# Patient Record
Sex: Female | Born: 1960 | Race: White | Hispanic: No | Smoking: Current every day smoker
Health system: Southern US, Community
[De-identification: ages and names within clinical notes are randomized; demographics above are authoritative.]

## PROBLEM LIST (undated history)

## (undated) DIAGNOSIS — F32A Depression, unspecified: Secondary | ICD-10-CM

## (undated) DIAGNOSIS — M545 Low back pain, unspecified: Secondary | ICD-10-CM

## (undated) DIAGNOSIS — K311 Adult hypertrophic pyloric stenosis: Secondary | ICD-10-CM

## (undated) DIAGNOSIS — F329 Major depressive disorder, single episode, unspecified: Secondary | ICD-10-CM

## (undated) DIAGNOSIS — G43909 Migraine, unspecified, not intractable, without status migrainosus: Secondary | ICD-10-CM

## (undated) DIAGNOSIS — K3189 Other diseases of stomach and duodenum: Secondary | ICD-10-CM

## (undated) DIAGNOSIS — G8929 Other chronic pain: Secondary | ICD-10-CM

## (undated) DIAGNOSIS — J189 Pneumonia, unspecified organism: Secondary | ICD-10-CM

## (undated) HISTORY — DX: Pneumonia, unspecified organism: J18.9

## (undated) HISTORY — PX: CERVICAL SPINE SURGERY: SHX589

## (undated) HISTORY — DX: Adult hypertrophic pyloric stenosis: K31.1

## (undated) HISTORY — DX: Other diseases of stomach and duodenum: K31.89

## (undated) HISTORY — PX: BACK SURGERY: SHX140

## (undated) HISTORY — PX: COLONOSCOPY: SHX174

## (undated) HISTORY — PX: TUBAL LIGATION: SHX77

## (undated) HISTORY — PX: ROTATOR CUFF REPAIR: SHX139

---

## 2006-01-21 ENCOUNTER — Ambulatory Visit (HOSPITAL_COMMUNITY): Admission: RE | Admit: 2006-01-21 | Discharge: 2006-01-21 | Payer: Self-pay | Admitting: Family Medicine

## 2006-02-01 ENCOUNTER — Ambulatory Visit: Payer: Self-pay | Admitting: Orthopedic Surgery

## 2006-02-11 ENCOUNTER — Encounter: Admission: RE | Admit: 2006-02-11 | Discharge: 2006-02-11 | Payer: Self-pay | Admitting: Orthopedic Surgery

## 2006-02-25 ENCOUNTER — Encounter: Admission: RE | Admit: 2006-02-25 | Discharge: 2006-02-25 | Payer: Self-pay | Admitting: Neurosurgery

## 2006-03-11 ENCOUNTER — Encounter: Admission: RE | Admit: 2006-03-11 | Discharge: 2006-03-11 | Payer: Self-pay | Admitting: Orthopedic Surgery

## 2006-03-24 ENCOUNTER — Ambulatory Visit: Payer: Self-pay | Admitting: Orthopedic Surgery

## 2006-05-04 ENCOUNTER — Emergency Department (HOSPITAL_COMMUNITY): Admission: EM | Admit: 2006-05-04 | Discharge: 2006-05-04 | Payer: Self-pay | Admitting: Emergency Medicine

## 2006-05-12 ENCOUNTER — Ambulatory Visit: Payer: Self-pay | Admitting: Orthopedic Surgery

## 2006-05-25 ENCOUNTER — Ambulatory Visit (HOSPITAL_COMMUNITY): Admission: RE | Admit: 2006-05-25 | Discharge: 2006-05-25 | Payer: Self-pay | Admitting: Obstetrics & Gynecology

## 2006-11-12 ENCOUNTER — Ambulatory Visit (HOSPITAL_COMMUNITY): Admission: RE | Admit: 2006-11-12 | Discharge: 2006-11-12 | Payer: Self-pay | Admitting: Obstetrics and Gynecology

## 2006-11-23 ENCOUNTER — Emergency Department (HOSPITAL_COMMUNITY): Admission: EM | Admit: 2006-11-23 | Discharge: 2006-11-23 | Payer: Self-pay | Admitting: Emergency Medicine

## 2007-06-15 ENCOUNTER — Ambulatory Visit (HOSPITAL_COMMUNITY): Admission: RE | Admit: 2007-06-15 | Discharge: 2007-06-15 | Payer: Self-pay | Admitting: Obstetrics and Gynecology

## 2007-11-14 ENCOUNTER — Ambulatory Visit (HOSPITAL_COMMUNITY): Admission: RE | Admit: 2007-11-14 | Discharge: 2007-11-14 | Payer: Self-pay | Admitting: Obstetrics and Gynecology

## 2008-07-05 ENCOUNTER — Other Ambulatory Visit: Admission: RE | Admit: 2008-07-05 | Discharge: 2008-07-05 | Payer: Self-pay | Admitting: Obstetrics and Gynecology

## 2008-08-28 ENCOUNTER — Ambulatory Visit (HOSPITAL_COMMUNITY): Admission: RE | Admit: 2008-08-28 | Discharge: 2008-08-28 | Payer: Self-pay | Admitting: Anesthesiology

## 2008-11-28 ENCOUNTER — Ambulatory Visit (HOSPITAL_COMMUNITY): Admission: RE | Admit: 2008-11-28 | Discharge: 2008-11-28 | Payer: Self-pay | Admitting: Obstetrics and Gynecology

## 2009-03-08 ENCOUNTER — Emergency Department (HOSPITAL_COMMUNITY): Admission: EM | Admit: 2009-03-08 | Discharge: 2009-03-08 | Payer: Self-pay | Admitting: Emergency Medicine

## 2009-03-11 ENCOUNTER — Encounter (INDEPENDENT_AMBULATORY_CARE_PROVIDER_SITE_OTHER): Payer: Self-pay | Admitting: *Deleted

## 2009-03-15 ENCOUNTER — Telehealth (INDEPENDENT_AMBULATORY_CARE_PROVIDER_SITE_OTHER): Payer: Self-pay | Admitting: *Deleted

## 2009-03-19 ENCOUNTER — Encounter: Payer: Self-pay | Admitting: Urgent Care

## 2009-03-19 ENCOUNTER — Ambulatory Visit: Payer: Self-pay | Admitting: Gastroenterology

## 2009-03-19 DIAGNOSIS — R1084 Generalized abdominal pain: Secondary | ICD-10-CM | POA: Insufficient documentation

## 2009-03-19 DIAGNOSIS — E876 Hypokalemia: Secondary | ICD-10-CM | POA: Insufficient documentation

## 2009-03-19 DIAGNOSIS — R112 Nausea with vomiting, unspecified: Secondary | ICD-10-CM

## 2009-03-19 DIAGNOSIS — R197 Diarrhea, unspecified: Secondary | ICD-10-CM

## 2009-03-20 ENCOUNTER — Encounter: Payer: Self-pay | Admitting: Gastroenterology

## 2009-03-20 ENCOUNTER — Ambulatory Visit (HOSPITAL_COMMUNITY): Admission: RE | Admit: 2009-03-20 | Discharge: 2009-03-20 | Payer: Self-pay | Admitting: Gastroenterology

## 2009-03-20 ENCOUNTER — Ambulatory Visit: Payer: Self-pay | Admitting: Gastroenterology

## 2009-03-22 LAB — CONVERTED CEMR LAB
Basophils Relative: 0 % (ref 0–1)
Chloride: 106 meq/L (ref 96–112)
Lymphocytes Relative: 33 % (ref 12–46)
Lymphs Abs: 2.8 10*3/uL (ref 0.7–4.0)
MCHC: 32.8 g/dL (ref 30.0–36.0)
Monocytes Relative: 6 % (ref 3–12)
Neutro Abs: 5.2 10*3/uL (ref 1.7–7.7)
Neutrophils Relative %: 61 % (ref 43–77)
RBC: 4.6 M/uL (ref 3.87–5.11)
Sed Rate: 10 mm/hr (ref 0–22)
Sodium: 138 meq/L (ref 135–145)
WBC: 8.6 10*3/uL (ref 4.0–10.5)

## 2009-03-26 ENCOUNTER — Telehealth (INDEPENDENT_AMBULATORY_CARE_PROVIDER_SITE_OTHER): Payer: Self-pay

## 2009-04-03 ENCOUNTER — Telehealth (INDEPENDENT_AMBULATORY_CARE_PROVIDER_SITE_OTHER): Payer: Self-pay | Admitting: *Deleted

## 2009-04-15 ENCOUNTER — Telehealth: Payer: Self-pay | Admitting: Gastroenterology

## 2009-05-01 ENCOUNTER — Ambulatory Visit: Payer: Self-pay | Admitting: Gastroenterology

## 2009-05-01 DIAGNOSIS — A048 Other specified bacterial intestinal infections: Secondary | ICD-10-CM | POA: Insufficient documentation

## 2009-05-02 ENCOUNTER — Encounter: Payer: Self-pay | Admitting: Gastroenterology

## 2009-06-04 ENCOUNTER — Ambulatory Visit (HOSPITAL_COMMUNITY): Admission: RE | Admit: 2009-06-04 | Discharge: 2009-06-05 | Payer: Self-pay | Admitting: Specialist

## 2009-08-08 ENCOUNTER — Other Ambulatory Visit: Admission: RE | Admit: 2009-08-08 | Discharge: 2009-08-08 | Payer: Self-pay | Admitting: Adult Health

## 2009-09-11 ENCOUNTER — Ambulatory Visit (HOSPITAL_COMMUNITY): Admission: RE | Admit: 2009-09-11 | Discharge: 2009-09-12 | Payer: Self-pay | Admitting: Orthopedic Surgery

## 2009-09-18 ENCOUNTER — Encounter: Payer: Self-pay | Admitting: Orthopedic Surgery

## 2009-09-27 ENCOUNTER — Encounter: Payer: Self-pay | Admitting: Gastroenterology

## 2009-12-09 ENCOUNTER — Ambulatory Visit (HOSPITAL_COMMUNITY): Admission: RE | Admit: 2009-12-09 | Discharge: 2009-12-09 | Payer: Self-pay | Admitting: Obstetrics and Gynecology

## 2010-05-16 ENCOUNTER — Emergency Department (HOSPITAL_COMMUNITY): Admission: EM | Admit: 2010-05-16 | Discharge: 2010-05-16 | Payer: Self-pay | Admitting: Emergency Medicine

## 2010-05-28 ENCOUNTER — Emergency Department (HOSPITAL_COMMUNITY): Admission: EM | Admit: 2010-05-28 | Discharge: 2010-05-28 | Payer: Self-pay | Admitting: Emergency Medicine

## 2010-06-09 ENCOUNTER — Ambulatory Visit (HOSPITAL_COMMUNITY): Payer: Self-pay | Admitting: Psychiatry

## 2010-07-17 ENCOUNTER — Encounter: Payer: Self-pay | Admitting: Gastroenterology

## 2010-07-23 ENCOUNTER — Encounter (INDEPENDENT_AMBULATORY_CARE_PROVIDER_SITE_OTHER): Payer: Self-pay | Admitting: *Deleted

## 2010-08-21 ENCOUNTER — Ambulatory Visit: Payer: Self-pay | Admitting: Vascular Surgery

## 2010-08-21 ENCOUNTER — Inpatient Hospital Stay (HOSPITAL_COMMUNITY): Admission: RE | Admit: 2010-08-21 | Discharge: 2010-08-23 | Payer: Self-pay | Admitting: Orthopedic Surgery

## 2010-08-22 ENCOUNTER — Encounter (INDEPENDENT_AMBULATORY_CARE_PROVIDER_SITE_OTHER): Payer: Self-pay | Admitting: Orthopedic Surgery

## 2010-08-24 ENCOUNTER — Emergency Department (HOSPITAL_COMMUNITY): Admission: EM | Admit: 2010-08-24 | Discharge: 2010-08-24 | Payer: Self-pay | Admitting: Emergency Medicine

## 2010-10-15 ENCOUNTER — Encounter (HOSPITAL_COMMUNITY)
Admission: RE | Admit: 2010-10-15 | Discharge: 2010-11-14 | Payer: Self-pay | Source: Home / Self Care | Admitting: Orthopedic Surgery

## 2010-11-04 ENCOUNTER — Ambulatory Visit: Payer: Self-pay | Admitting: Gastroenterology

## 2010-11-05 ENCOUNTER — Ambulatory Visit (HOSPITAL_COMMUNITY): Payer: Self-pay | Admitting: Psychiatry

## 2010-11-06 DIAGNOSIS — K5289 Other specified noninfective gastroenteritis and colitis: Secondary | ICD-10-CM | POA: Insufficient documentation

## 2010-11-12 ENCOUNTER — Ambulatory Visit (HOSPITAL_COMMUNITY): Payer: Self-pay | Admitting: Psychiatry

## 2010-11-17 ENCOUNTER — Encounter (HOSPITAL_COMMUNITY)
Admission: RE | Admit: 2010-11-17 | Discharge: 2010-12-17 | Payer: Self-pay | Source: Home / Self Care | Attending: Orthopedic Surgery | Admitting: Orthopedic Surgery

## 2010-11-19 ENCOUNTER — Ambulatory Visit (HOSPITAL_COMMUNITY): Payer: Self-pay | Admitting: Psychiatry

## 2010-11-26 ENCOUNTER — Ambulatory Visit (HOSPITAL_COMMUNITY): Payer: Self-pay | Admitting: Psychiatry

## 2010-11-27 ENCOUNTER — Emergency Department (HOSPITAL_COMMUNITY)
Admission: EM | Admit: 2010-11-27 | Discharge: 2010-11-27 | Payer: Self-pay | Source: Home / Self Care | Admitting: Emergency Medicine

## 2010-12-11 ENCOUNTER — Ambulatory Visit (HOSPITAL_COMMUNITY)
Admission: RE | Admit: 2010-12-11 | Discharge: 2010-12-11 | Payer: Self-pay | Source: Home / Self Care | Attending: Obstetrics and Gynecology | Admitting: Obstetrics and Gynecology

## 2010-12-18 ENCOUNTER — Encounter (HOSPITAL_COMMUNITY)
Admission: RE | Admit: 2010-12-18 | Discharge: 2011-01-17 | Payer: Self-pay | Source: Home / Self Care | Attending: Orthopedic Surgery | Admitting: Orthopedic Surgery

## 2011-01-09 ENCOUNTER — Encounter
Admission: RE | Admit: 2011-01-09 | Discharge: 2011-01-09 | Payer: Self-pay | Source: Home / Self Care | Attending: Orthopedic Surgery | Admitting: Orthopedic Surgery

## 2011-01-16 NOTE — Op Note (Signed)
Rita [name], Rita [name]               ACCOUNT NO.:  [id number]  MEDICAL RECORD NO.:  [id number]          PATIENT TYPE:  OIB  LOCATION:  5030                         FACILITY:  [facility]  PHYSICIAN:  Alvy Beal, MD    DATE OF BIRTH:  02/01/1961  DATE OF PROCEDURE: DATE OF DISCHARGE:                              OPERATIVE REPORT  PREOPERATIVE DIAGNOSIS:  C5-6 disk herniation with right C6 radiculopathy.  POSTOPERATIVE DIAGNOSIS:  C5-6 disk herniation with right C6 radiculopathy.  PROCEDURE:  Anterior cervical diskectomy and fusion C5-6.  COMPLICATIONS:  None.  FIRST ASSISTANT:  Crissie Reese, PA  INTRAOPERATIVE FINDINGS:  Significant right C5-6 disk herniation removed without incident, placed a 7-mm allograft precut MTF lordotic spacer packed with Actifuse with a 14-mm anterior cervical Synthes Vector plate with 69-GE locking screws.  HISTORY:  This is a very pleasant 50 year old woman who presented with a 77-month history of progressive debilitating neck and right arm pain.  A clinical evaluation was significant for C6 radiculopathy with weakness in the C6 distribution.  After discussing treatment options, she elected to proceed with surgery.  All appropriate risks, benefits and alternatives to surgery were discussed and consent was obtained.  OPERATIVE NOTE:  The patient was brought to the operating room and placed supine on the operating table.  After successful induction of general anesthesia and endotracheal intubation, TED and SCDs were applied.  A folded sheet was placed between the shoulder blades and shoulders were taped down and then the neck was gently placed into slight extension.  The anterior cervical spine was prepped and draped in the usual standard fashion.  Appropriate preoperative time-out was done confirming patient, procedure and extremity of pain.  Once done, I proceeded with the surgery.  A transverse incision was made just at the level of the  cricoid cartilage.  Sharp dissection was carried out down through the deep fascia and through the platysma.  I then sharply dissected along the medial border of the sternocleidomastoid and performed a standard Evlyn Clines approach to the anterior cervical spine.  I gently swept the trachea and esophagus medially with my finger and protected it with a thyroid retractor.  I then visualized and protected the carotid sheath laterally with my finger until I could mobilize using Kittner dissectors the prevertebral fascia.  Once this was done, I placed a needle into the C5-6 disk space, took an intraoperative x-ray and confirmed I was at the appropriate level.  Once confirmed, I then mobilized the longus colli muscles out laterally from the midbody of C5 to the midbody of C6 until I could visualize the uncovertebral joints.  Caspar self retracting blades were placed into the wound.  The endotracheal cuff was deflated and the blades were expanded to the appropriate width.  Distraction pins were then placed into the bodies of C5 and C6 and then gently distracted to the C5-6 disk space.  An annulotomy was performed using a 15 blade scalpel and then using a combination of pituitary rongeurs, curettes and Kerrison rongeurs, I removed the entire disk material.  Using a micro nerve hook, I developed a plane underneath the posterior  longitudinal ligament and then used a 1-mm Kerrison to resect the posterior longitudinal ligament.  This allowed me to remove the bone spur and disk herniation and was mostly largely to the right.  At this point, I could see the anterior thecal sac and I was able to freely sweep my nerve hook underneath the endplates and underneath the uncovertebral joint with no significant problem.  I then rasped the endplates, measured and obtained a 7-mm precut lordotic MTF lower fibular strut, packed it with Actifuse and malleted it to the appropriate depth.  An anterior  cervical plate was then contoured and secured to the bodies of C5-C6 and then final x-rays were taken.  All retracting devices were removed.  I checked to ensure the esophagus was not entrapped beneath the plate.  I returned the trachea and esophagus to midline, obtained hemostasis using bipolar electrocautery, closed the platysma with interrupted 2-0 Vicryl sutures and the skin with 3-0 Monocryl.  Steri-Strips and dry dressing were applied.  The patient was extubated and transferred to the PACU without incident.  At the end of the case, all needle and sponge counts were correct.     Alvy Beal, MD Electronically Signed    DDB/MEDQ  D:  09/11/2009  T:  09/12/2009  Job:  [id number]  Electronically Signed by Venita Lick MD on 01/15/2011 08:45:52 PM

## 2011-01-18 ENCOUNTER — Encounter: Payer: Self-pay | Admitting: Obstetrics and Gynecology

## 2011-01-23 ENCOUNTER — Encounter: Payer: Self-pay | Admitting: Gastroenterology

## 2011-01-27 NOTE — Letter (Signed)
Summary: [facility] CA center note  [facility] center note   Imported By: Minna Merritts 07/17/2010 16:45:01  _____________________________________________________________________  External Attachment:    Type:   Image     Comment:   External Document

## 2011-01-27 NOTE — Letter (Signed)
Summary: Medical record req Disab Determination  Medical record req Disab Determination   Imported By: Cammie Sickle 04/19/2010 12:13:19  _____________________________________________________________________  External Attachment:    Type:   Image     Comment:   External Document

## 2011-01-27 NOTE — Letter (Signed)
Summary: Generic Letter, Intro to Referring  [facility]  [address]   [place], [place] [address]   Phone: [phone]  Fax: [phone]      July 23, 2010             RE: Rita [name]   1961-11-13                 [address]                 [place], [place]  [address]  Dear Kemper Durie,   Pt was a no show for her appointment yesterday to see Tana Coast, P.A.            Sincerely,    Diana Eves  [facility] Ph: [phone]   Fax: [phone]

## 2011-01-27 NOTE — Assessment & Plan Note (Signed)
Summary: Viral gastroenetritis   Visit Type:  Follow-up Visit Primary Care Provider:  Lubertha South, M.D.  Chief Complaint:  Colitis.  History of Present Illness: Seen in ED for abd pain and vomiting. CT A/P June 2011-showed ? bowel wall thickening in the descending colon. Sx lasted: 5 days. Repeat CT in AUG 2011: showed hemangioma in liver but nl bowel wall. Last TCS MAR 2010. Bms: having constipation, Rx: Dulcolax-2-3x/wk. No blood in stool. No black tarry stools. Appetite: none due to pain from surgery: AUG 25th. Pt now 143 lbs. Gained 3 lbs since [facility] 2010.   Current Medications (verified): 1)  Xanax 0.25 Mg Tabs (Alprazolam) .... Q 6 Hours 2)  Roxicodone 15 Mg Tabs (Oxycodone Hcl) .... One Tablet Every 4 Hours 3)  Cymbalta 60 Mg Cpep (Duloxetine Hcl) .... Take 1 Tablet By Mouth Once A Day  Allergies (verified): No Known Drug Allergies  Past History:  Past Medical History: ? Colitis V. Acute Gastroeneteritis-JUNE 2011 H. 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 Hardie Pulley [facility]) Pain management with Dr. Thyra Breed (last seen Jan 2010)  Past Surgical History: C-sect 1991 Tubal ligation 1992  Family History: Reviewed history from 03/19/2009 and no changes required. No known family history of colorectal carcinoma, IBD, liver or chronic GI problems. Father: (21's) ?  Mother: (60 deceased)  COPD Siblings: 1 brother deceased ? 2 living brothers- healthy  Vital Signs:  Patient profile:   50 year old female Height:      62 inches Weight:      143 pounds BMI:     26.25 Temp:     98.7 degrees F oral Pulse rate:   88 / minute BP sitting:   110 / 72  (left arm) Cuff size:   regular  Vitals Entered By: Cloria Spring LPN (November 04, 2010 9:13 AM)  Physical Exam  General:  Well developed, well nourished, no acute distress. Head:  Normocephalic and atraumatic. Mouth:  No deformity  or lesions. Lungs:  Clear throughout to auscultation. Heart:  Regular rate and rhythm; no murmurs. Abdomen:  Soft, nontender and nondistended. Normal bowel sounds.  Impression & Recommendations:  Problem # 1:  ABDOMINAL PAIN, GENERALIZED (ICD-789.07) Assessment Improved in June 2011, Sx most likely 2o to acute viral gastroenteritis. Pt currentlyASx. No indication for additional GI workup. OPV in 24 mos.  CC: PCP  Problem # 2:  SCREENING, COLON CANCER (ICD-V76.51) Assessment: Comment Only TCS 2020  Appended Document: Viral gastroenetritis REMINDER OF F/U OPV IS IN THE COMPUTER  Appended Document: Orders Update    Clinical Lists Changes  Problems: Added new problem of COLITIS (ICD-558.9) Orders: Added new Service order of Est. Patient Level III ([address]) - Signed

## 2011-01-29 NOTE — Letter (Signed)
Summary: AUTH FOR RELEASE OF MED INFO  AUTH FOR RELEASE OF MED INFO   Imported By: Rexene Alberts 01/23/2011 11:44:10  _____________________________________________________________________  External Attachment:    Type:   Image     Comment:   External Document

## 2011-02-04 IMAGING — CT CT ABD-PELV W/ CM
2 of 5 series · 17 of 46 positions shown, 19 images · IV contrast (Omnipaque 300)
Comparison: 03/08/2009

CLINICAL DATA: Nausea, vomiting, chills, abdominal pain, diarrhea

CT ABDOMEN AND PELVIS WITH CONTRAST
TECHNIQUE: Multidetector CT imaging of the abdomen and pelvis was
performed following the standard protocol during bolus
administration of intravenous contrast. Breast shield utilized.
Sagittal and coronal MPR images reconstructed from axial data set.
Contrast: 100 ml Fmnipaque-B66 IV; water as oral contrast

[Series 2: abd_pel_with 5.0 b40f · axial · 0.66mm/px · z∈[-460,-66]mm · 14 of 91 slices shown, 16 images]
[im 6/91  soft-tissue]
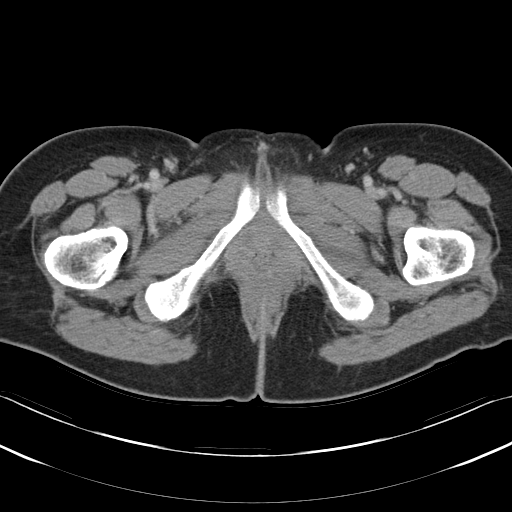
[im 6/91  bone]
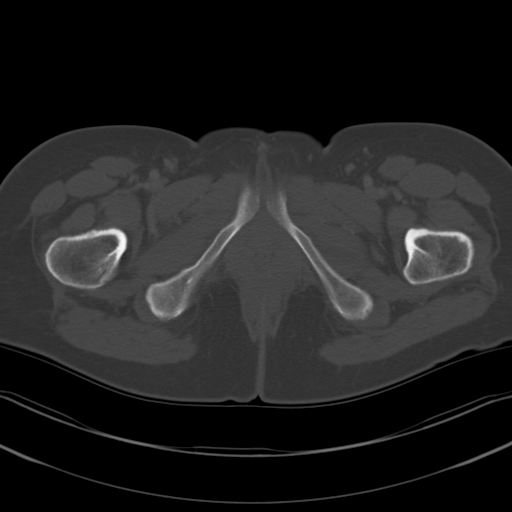
[im 11/91  soft-tissue]
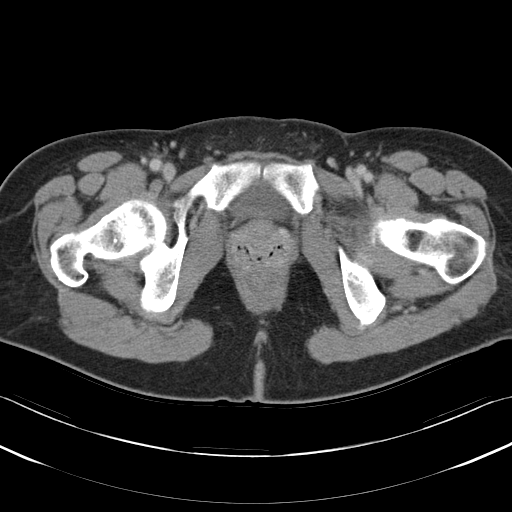
[im 16/91  soft-tissue]
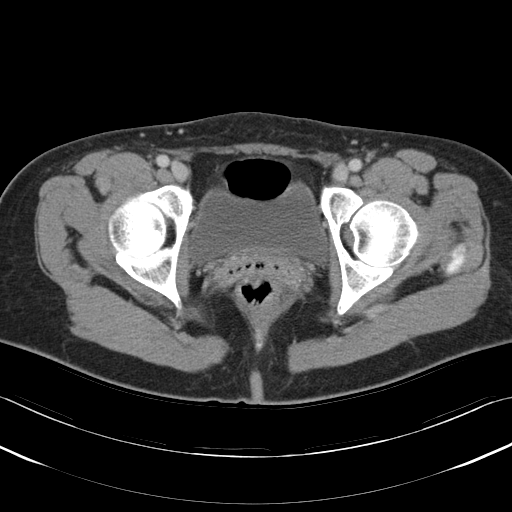
[im 27/91  soft-tissue]
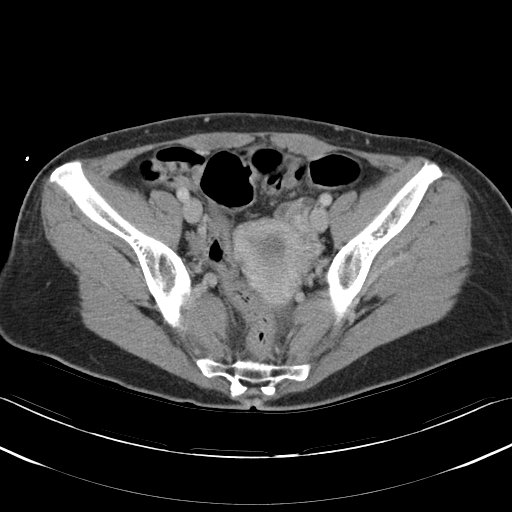
[im 32/91  soft-tissue]
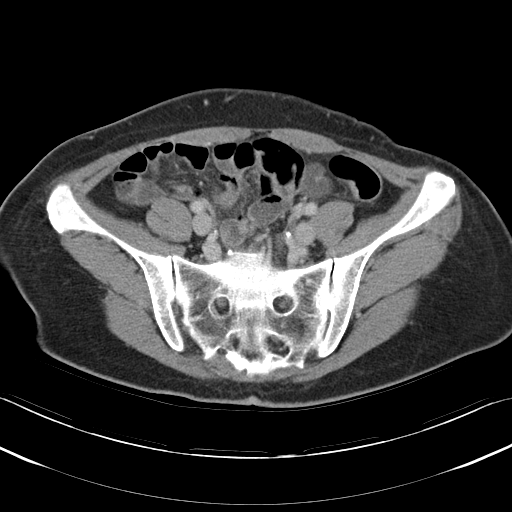
[im 38/91  soft-tissue]
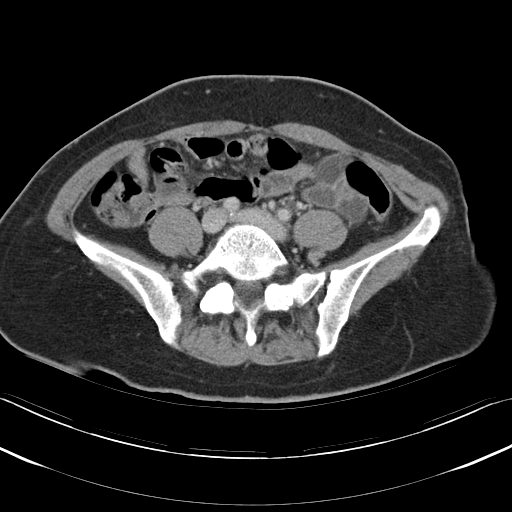
[im 43/91  soft-tissue]
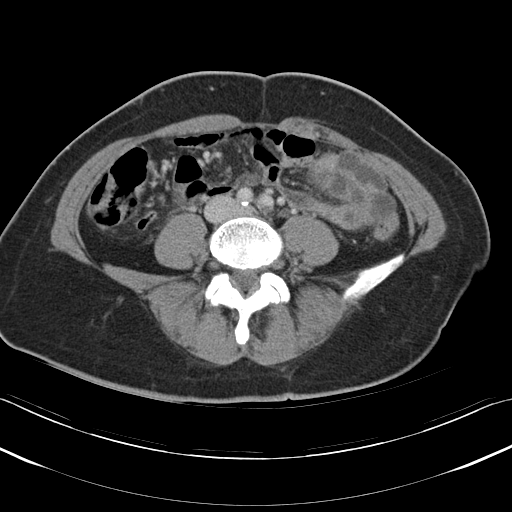
[im 48/91  soft-tissue]
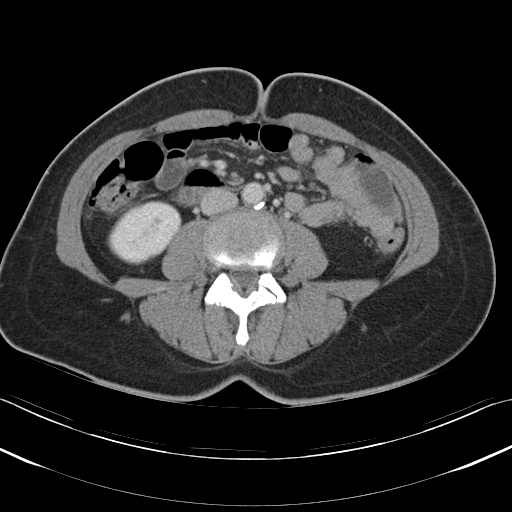
[im 53/91  soft-tissue]
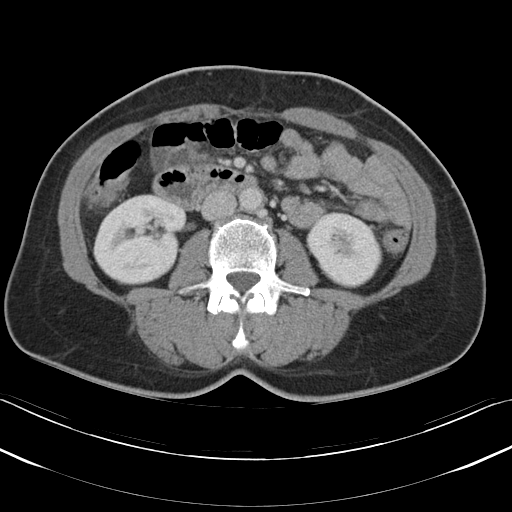
[im 53/91  bone]
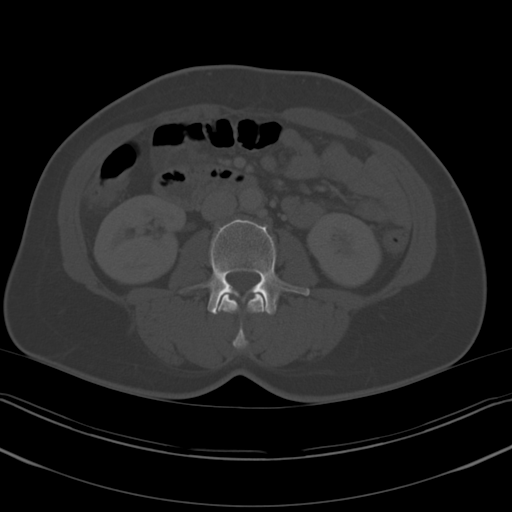
[im 59/91  soft-tissue]
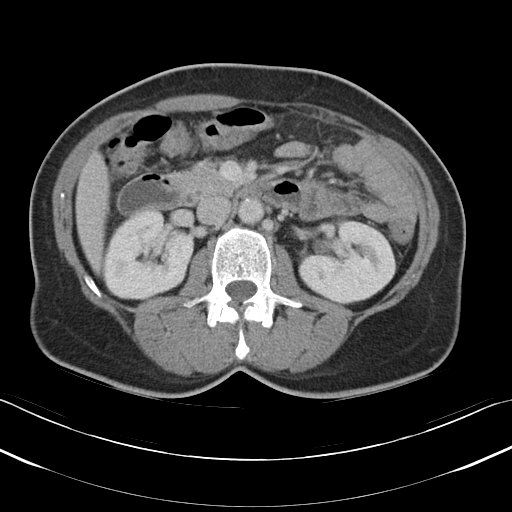
[im 69/91  soft-tissue]
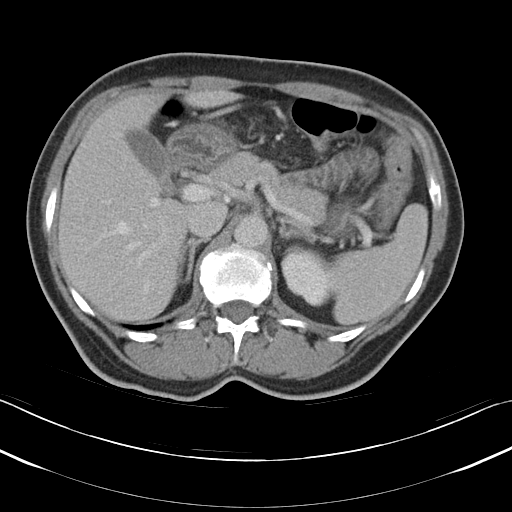
[im 75/91  soft-tissue]
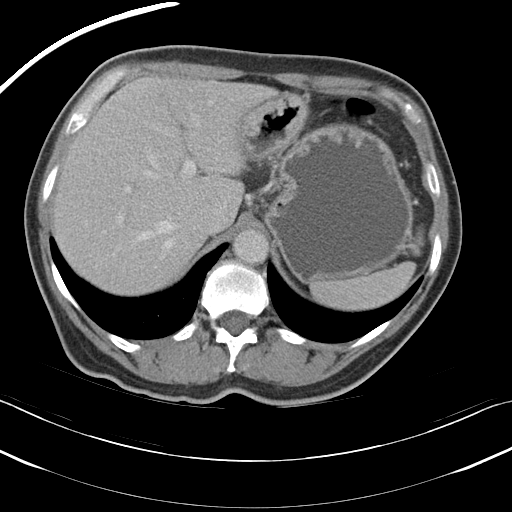
[im 80/91  soft-tissue]
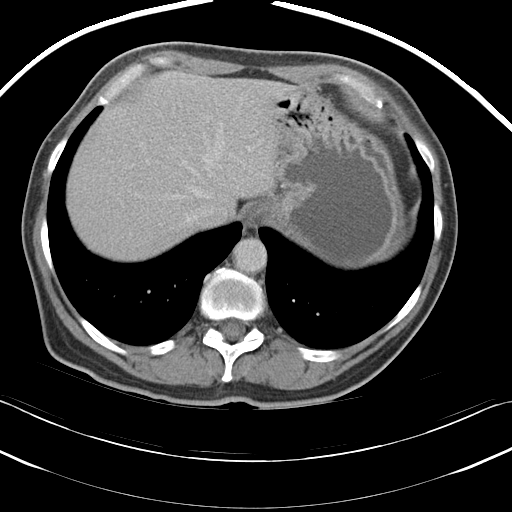
[im 85/91  soft-tissue]
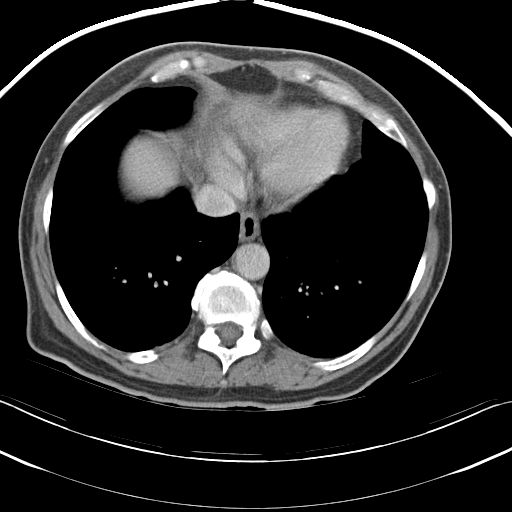

[Series 5: mpr cor post contrast (id) · coronal · 0.78mm/px · 3 of 75 slices shown]
[im 25/75  soft-tissue]
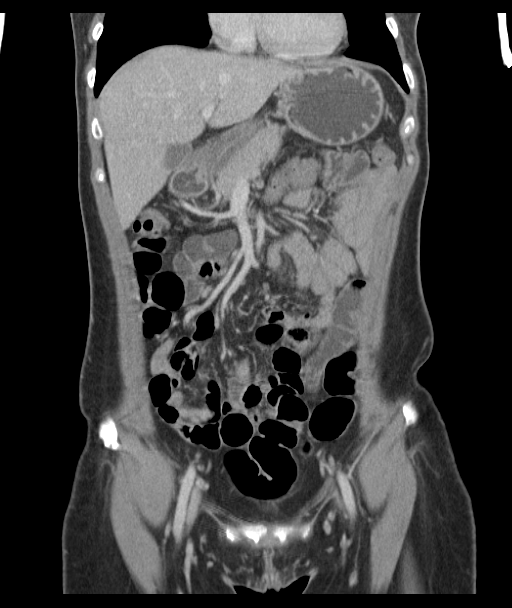
[im 33/75  soft-tissue]
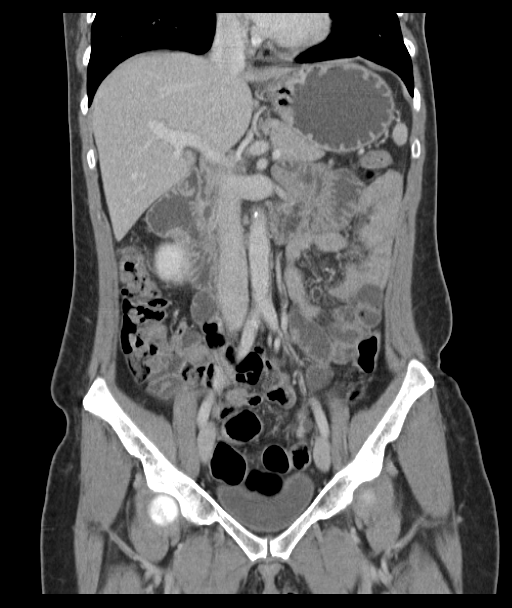
[im 42/75  soft-tissue]
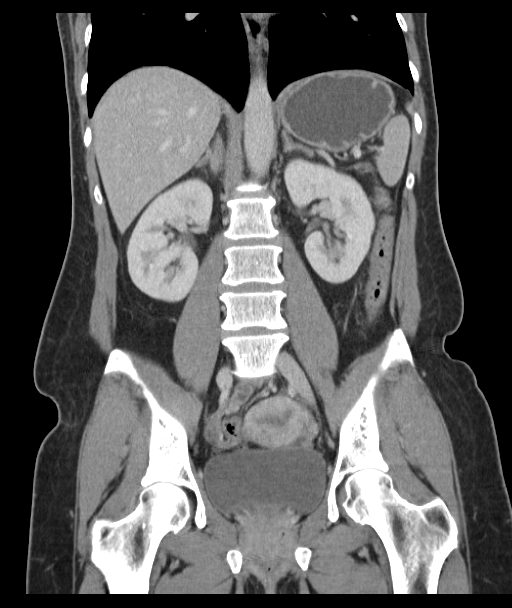

[17 of 46 positions shown; findings below may reference images not displayed]

FINDINGS: Lung bases clear.
Liver, spleen, pancreas, kidneys, and adrenal glands normal
appearance.
Normal appendix.
Stomach unremarkable.
Questionable wall thickening of descending colon versus
underdistension artifact.
Remaining bowel loops unremarkable grossly unremarkable for exam
limited by lack of contrast and adequate distention.
Small amount nonspecific free pelvic fluid.
Unremarkable bladder, distal ureters, uterus and adnexae.
No mass, adenopathy, or hernia.
No acute bony findings.
IMPRESSION: Questionable bowel wall thickening versus underdistension artifact
at descending colon, cannot exclude descending colitis.
Small amount nonspecific free pelvic fluid.
Remainder of exam unremarkable.

## 2011-03-09 LAB — DIFFERENTIAL
Basophils Absolute: 0 10*3/uL (ref 0.0–0.1)
Basophils Relative: 0 % (ref 0–1)
Eosinophils Relative: 0 % (ref 0–5)
Monocytes Absolute: 0.5 10*3/uL (ref 0.1–1.0)

## 2011-03-09 LAB — BASIC METABOLIC PANEL
BUN: 6 mg/dL (ref 6–23)
GFR calc non Af Amer: >60 mL/min (ref >60)
Glucose, Bld: 80 mg/dL (ref 70–99)
Potassium: 3.7 mEq/L (ref 3.5–5.1)

## 2011-03-09 LAB — POCT CARDIAC MARKERS
CKMB, poc: <1 ng/mL — ABNORMAL LOW (ref 1.0–8.0)
Myoglobin, poc: 23.9 ng/mL (ref 12–200)

## 2011-03-09 LAB — CBC
HCT: 38.3 % (ref 36.0–46.0)
MCHC: 35.5 g/dL (ref 30.0–36.0)
MCV: 85.1 fL (ref 78.0–100.0)
RDW: 14.3 % (ref 11.5–15.5)

## 2011-03-13 LAB — URINALYSIS, ROUTINE W REFLEX MICROSCOPIC
Hgb urine dipstick: NEGATIVE
Specific Gravity, Urine: 1.015 (ref 1.005–1.030)
Urobilinogen, UA: 1 mg/dL (ref 0.0–1.0)

## 2011-03-13 LAB — DIFFERENTIAL
Basophils Relative: 0 % (ref 0–1)
Lymphs Abs: 3 10*3/uL (ref 0.7–4.0)
Monocytes Relative: 10 % (ref 3–12)
Neutro Abs: 4.9 10*3/uL (ref 1.7–7.7)
Neutrophils Relative %: 55 % (ref 43–77)

## 2011-03-13 LAB — CBC
HCT: 37.7 % (ref 36.0–46.0)
Hemoglobin: 10.3 g/dL — ABNORMAL LOW (ref 12.0–15.0)
MCHC: 32.9 g/dL (ref 30.0–36.0)
Platelets: 183 10*3/uL (ref 150–400)
RBC: 3.5 MIL/uL — ABNORMAL LOW (ref 3.87–5.11)
RDW: 15.2 % (ref 11.5–15.5)
WBC: 9.5 10*3/uL (ref 4.0–10.5)

## 2011-03-13 LAB — BASIC METABOLIC PANEL
Calcium: 8.3 mg/dL — ABNORMAL LOW (ref 8.4–10.5)
GFR calc Af Amer: >60 mL/min (ref >60)
GFR calc non Af Amer: >60 mL/min (ref >60)
Sodium: 138 mEq/L (ref 135–145)

## 2011-03-13 LAB — TYPE AND SCREEN
ABO/RH(D): A POS
Antibody Screen: NEGATIVE

## 2011-03-13 LAB — POCT PREGNANCY, URINE: Preg Test, Ur: NEGATIVE

## 2011-03-16 LAB — DIFFERENTIAL
Basophils Relative: 0 % (ref 0–1)
Basophils Relative: 0 % (ref 0–1)
Eosinophils Absolute: 0 10*3/uL (ref 0.0–0.7)
Eosinophils Absolute: 0 10*3/uL (ref 0.0–0.7)
Eosinophils Relative: 0 % (ref 0–5)
Eosinophils Relative: 0 % (ref 0–5)
Lymphs Abs: 3 10*3/uL (ref 0.7–4.0)
Monocytes Relative: 2 % — ABNORMAL LOW (ref 3–12)
Neutrophils Relative %: 89 % — ABNORMAL HIGH (ref 43–77)
Neutrophils Relative %: 73 % (ref 43–77)

## 2011-03-16 LAB — LIPASE, BLOOD
Lipase: 31 U/L (ref 11–59)
Lipase: 108 U/L — ABNORMAL HIGH (ref 11–59)

## 2011-03-16 LAB — COMPREHENSIVE METABOLIC PANEL
ALT: 16 U/L (ref 0–35)
AST: 22 U/L (ref 0–37)
Albumin: 4 g/dL (ref 3.5–5.2)
Alkaline Phosphatase: 71 U/L (ref 39–117)
BUN: 9 mg/dL (ref 6–23)
CO2: 24 mEq/L (ref 19–32)
Chloride: 104 mEq/L (ref 96–112)
Creatinine, Ser: 0.59 mg/dL (ref 0.4–1.2)
GFR calc Af Amer: >60 mL/min (ref >60)
GFR calc non Af Amer: >60 mL/min (ref >60)
Glucose, Bld: 128 mg/dL — ABNORMAL HIGH (ref 70–99)
Glucose, Bld: 122 mg/dL — ABNORMAL HIGH (ref 70–99)
Potassium: 2.6 mEq/L — CL (ref 3.5–5.1)
Sodium: 139 mEq/L (ref 135–145)
Total Bilirubin: 0.9 mg/dL (ref 0.3–1.2)
Total Protein: 7.2 g/dL (ref 6.0–8.3)

## 2011-03-16 LAB — CBC
HCT: 42.3 % (ref 36.0–46.0)
Hemoglobin: 16.8 g/dL — ABNORMAL HIGH (ref 12.0–15.0)
MCHC: 33.2 g/dL (ref 30.0–36.0)
MCHC: 35.1 g/dL (ref 30.0–36.0)
MCV: 87.9 fL (ref 78.0–100.0)
Platelets: 191 10*3/uL (ref 150–400)
RBC: 5.61 MIL/uL — ABNORMAL HIGH (ref 3.87–5.11)
RDW: 14.7 % (ref 11.5–15.5)
WBC: 16 10*3/uL — ABNORMAL HIGH (ref 4.0–10.5)

## 2011-03-16 LAB — URINALYSIS, ROUTINE W REFLEX MICROSCOPIC
Bilirubin Urine: NEGATIVE
Glucose, UA: NEGATIVE mg/dL
Hgb urine dipstick: NEGATIVE
Ketones, ur: >80 mg/dL — AB
Protein, ur: NEGATIVE mg/dL
Specific Gravity, Urine: 1.01 (ref 1.005–1.030)
Urobilinogen, UA: 1 mg/dL (ref 0.0–1.0)
Urobilinogen, UA: 0.2 mg/dL (ref 0.0–1.0)

## 2011-03-16 LAB — URINE MICROSCOPIC-ADD ON

## 2011-04-03 LAB — CBC
MCHC: 33.6 g/dL (ref 30.0–36.0)
MCV: 90.4 fL (ref 78.0–100.0)
Platelets: 165 10*3/uL (ref 150–400)
RBC: 4.12 MIL/uL (ref 3.87–5.11)
WBC: 7.8 10*3/uL (ref 4.0–10.5)

## 2011-04-03 LAB — BASIC METABOLIC PANEL
BUN: 4 mg/dL — ABNORMAL LOW (ref 6–23)
CO2: 24 mEq/L (ref 19–32)
Calcium: 9.2 mg/dL (ref 8.4–10.5)
Chloride: 107 mEq/L (ref 96–112)
Creatinine, Ser: 0.62 mg/dL (ref 0.4–1.2)
GFR calc Af Amer: >60 mL/min (ref >60)

## 2011-04-03 LAB — DIFFERENTIAL
Basophils Relative: 1 % (ref 0–1)
Eosinophils Absolute: 0 10*3/uL (ref 0.0–0.7)
Neutro Abs: 4.2 10*3/uL (ref 1.7–7.7)
Neutrophils Relative %: 54 % (ref 43–77)

## 2011-04-06 LAB — CBC
HCT: 37.5 % (ref 36.0–46.0)
Hemoglobin: 12.2 g/dL (ref 12.0–15.0)
RBC: 4.18 MIL/uL (ref 3.87–5.11)
WBC: 8 10*3/uL (ref 4.0–10.5)

## 2011-04-06 LAB — BASIC METABOLIC PANEL
Calcium: 9.4 mg/dL (ref 8.4–10.5)
GFR calc Af Amer: >60 mL/min (ref >60)
GFR calc non Af Amer: >60 mL/min (ref >60)
Potassium: 4.1 mEq/L (ref 3.5–5.1)
Sodium: 140 mEq/L (ref 135–145)

## 2011-04-09 LAB — URINALYSIS, ROUTINE W REFLEX MICROSCOPIC
Glucose, UA: NEGATIVE mg/dL
Protein, ur: NEGATIVE mg/dL
Specific Gravity, Urine: 1.015 (ref 1.005–1.030)
pH: 6.5 (ref 5.0–8.0)

## 2011-04-09 LAB — DIFFERENTIAL
Basophils Absolute: 0 10*3/uL (ref 0.0–0.1)
Basophils Relative: 0 % (ref 0–1)
Eosinophils Relative: 0 % (ref 0–5)
Monocytes Absolute: 0.4 10*3/uL (ref 0.1–1.0)
Neutro Abs: 7.7 10*3/uL (ref 1.7–7.7)

## 2011-04-09 LAB — COMPREHENSIVE METABOLIC PANEL
AST: 20 U/L (ref 0–37)
Albumin: 3.6 g/dL (ref 3.5–5.2)
Alkaline Phosphatase: 47 U/L (ref 39–117)
BUN: 9 mg/dL (ref 6–23)
CO2: 25 mEq/L (ref 19–32)
Chloride: 104 mEq/L (ref 96–112)
GFR calc non Af Amer: >60 mL/min (ref >60)
Potassium: 3.4 mEq/L — ABNORMAL LOW (ref 3.5–5.1)
Total Bilirubin: 0.3 mg/dL (ref 0.3–1.2)

## 2011-04-09 LAB — CBC
HCT: 36.5 % (ref 36.0–46.0)
Hemoglobin: 12.3 g/dL (ref 12.0–15.0)
Platelets: 173 10*3/uL (ref 150–400)
RBC: 4.17 MIL/uL (ref 3.87–5.11)
WBC: 9.5 10*3/uL (ref 4.0–10.5)

## 2011-05-12 NOTE — Op Note (Signed)
Rita [name], Rita [name]               ACCOUNT NO.:  [id number]   MEDICAL RECORD NO.:  [id number]          PATIENT TYPE:  OIB   LOCATION:  1603                         FACILITY:  [facility]   PHYSICIAN:  Jene Every, M.D.    DATE OF BIRTH:  03/09/1961   DATE OF PROCEDURE:  06/04/2009  DATE OF DISCHARGE:  06/05/2009                               OPERATIVE REPORT   PREOPERATIVE DIAGNOSES:  Adhesive capsulitis, rotator cuff tear, labral  tear, left shoulder.   POSTOPERATIVE DIAGNOSES:  Adhesive capsulitis, rotator cuff tear, labral  tear, left shoulder.   PROCEDURE PERFORMED:  1. Exam under anesthesia followed by manipulation under anesthesia.  2. Left shoulder arthroscopy with debridement of labral tear.  3. Mini open rotator cuff repair with Mitek suture anchor and      subacromial decompression and acromioplasty.   BRIEF HISTORY:  A 50 year old with  history shoulder pain, adhesive  capsulitis, had a history of cervical spondylosis and referred pain to  the shoulder generating adhesions.  She also had a near full-thickness  tear of the rotator cuff and a labral tear.  She is indicated for  evaluation and manipulation under anesthesia, possible open rotator cuff  repair and debridement of labrum.  Risks and benefits discussed  including bleeding, infection, suboptimal range of motion, persistent  pain, need for revision, recurrent adhesions, persistent pain, etc.   TECHNIQUE:  The patient in supine beach-chair position after induction  of adequate general anesthesia, 1 gram Kefzol.  Exam under anesthesia  revealed active abduction only to 90 degrees and forward flexion to 90  degrees.  External rotation actually was 30 degrees and internal  rotation __________ .  With gentle manipulation under anesthesia, I  improved her forward flexion to 160 degrees, her abduction 120 degrees,  __________ rotation L4.  External rotation was 40 degrees.  This was  very gentle over a period of time, no  torsion on the humerus.  Lysis of  adhesions was appreciated.  Next with the arm in 70/30 position after  the prepping and draping of the shoulder in the usual sterile fashion, I  delineated the acromion, AC joint and coracoid with a surgical marker  and then the arm in 70/30 position, made an incision through the skin  only for the posterolateral portal and for the anterior portal closer to  the anteromedial aspect of the acromion.  I advanced the cannula into  the glenohumeral space penetrating atraumatically.  Irrigant was  utilized to insufflate the joint.  I found degenerative tearing of the  labrum superiorly and anteriorly.  I introduced a cannula through the  anterior portal under direct visualization beneath the biceps tendon  penetrating the capsule.  This was probed.  It was attached.  There was  no SLAP 2 lesion.  There was some degenerative tearing and I introduced  a 35 shaver and debrided it superiorly, anteriorly to a stable base.  Subscap was unremarkable.  Biceps tendon was unremarkable.  The  supraspinatus just lateral to the bicipital groove there was significant  tear through the rotator cuff that extended upwards.  This  was  consistent with that seen on the MRI, introduced an 18 gauge needle the  percutaneously penetrating through the deltoid and the rotator cuff.  Then under direct visualization I threaded a PDS needle to identify  where the tear was, removed the needle.  Then converted this to a mini  open procedure.  The scope was then removed and I made a small  anterolateral incision over the acromion approximately 2 cm in length.  Subcutaneous tissue was dissected.  Electrocautery utilized to achieve  hemostasis.  Raphe between the anterolateral heads identified.  Subperiosteally elevated from the anterolateral and anteromedial aspect  of the acromion preserving the deltoid attachment.  CA ligament  identified and excised.  Identified the entrance of the rotator  cuff,  was extremely attenuated, devascularized portion where the a small tear  was.  I incised that area, debrided the tendon approximately 1 cm in  length.  Good bleeding tissue noted.  Then I curetted bone just beneath  that.  This was lateral to the bicipital groove and the insertion of the  supraspinatus.  Next, I removed the Ethibond suture and proceeded with a  subacromial decompression.  I lysed adhesions digitally.  And then with  the rotator cuff well protected used oscillating saw and removed  approximately 3 mm of the acromion anteriorly and laterally.  This was  removed with a Baer rongeur and 3 mm Kerrison.  The significantly opened  the subacromial space.  A good range of motion with the digit in the  subacromial space was achieved.  Wound copiously irrigated.  Full  inspection of the cuff revealed no other tear but the one that was  identified.  Next, I placed a Mitek suture anchor into the bone with  excellent purchase.  No pullout.  Threaded to either side of the incised  the tendon.  A good surgeon's knot delivered into the small bony trough.  This was tied with a good surgeon's knot and then oversewed that area  with #1 Vicryl figure-of-eight sutures.  Good range.  No tension on the  wound noted.  I copiously irrigated the wound and then closed the raphe  with #1 Vicryl interrupted figure-of-eight sutures over top through the  acromion, subcu with 2-0 Vicryl simple sutures.  Skin was reapproximated  4-0 subcuticular Prolene.  Wound reinforced with Steri-Strips.  Sterile  dressing applied.  Placed supine on hospital bed first in abduction  pillow and sling, extubated without difficulty, transferred to recovery  room in satisfactory addition.  The patient tolerated procedure well  with no complications.   ASSISTANT:  Roma Schanz, P.A.      Jene Every, M.D.  Electronically Signed     JB/MEDQ  D:  06/04/2009  T:  06/05/2009  Job:  [id number]

## 2011-05-12 NOTE — Op Note (Signed)
NAMEKANIYA, TRUEHEART               ACCOUNT NO.:  [id number]   MEDICAL RECORD NO.:  [id number]          PATIENT TYPE:  AMB   LOCATION:  DAY                           FACILITY:  [facility]   PHYSICIAN:  Kassie Mends, M.D.      DATE OF BIRTH:  1961-10-06   DATE OF PROCEDURE:  DATE OF DISCHARGE:                               OPERATIVE REPORT   REFERRING Shloka Baldridge:  Tilda Burrow, MD   PROCEDURE:  1. Colonoscopy with snare cautery and cold forceps polypectomy as well      as random cold forceps biopsies.  2. Esophagogastroduodenoscopy with cold forceps biopsies of the      gastric and duodenal mucosa.   INDICATION FOR EXAM:  Ms. Artiaga is a 50 year old female who has no  family history of colon cancer or colon polyps.  She presents with 3  weeks of vomiting, abdominal pain, and diarrhea.  She also complains of  weight loss.  Her body mass index is 25.7 (slightly overweight).  She  has a history of chronic back pain and is currently under pain  management with Dr. Thyra Breed.  She chronically uses Vicodin and  Xanax.  She had been using Mylanta every 4 hours for at least the past  week.   FINDINGS:  1. Normal terminal ileum, approximately 10 cm visualized.  2. Slightly tortuous colon.  A 3-mm descending colon polyp removed via      cold forceps.  A 6-mm sessile rectal polyp removed via snare      cautery.  Random cold forceps biopsies obtained to evaluate for      microscopic colitis or eosinophilic gastroenteritis.  Otherwise, no      evidence of masses, inflammatory changes, diverticula, or AVMs      seen.  3. Moderate internal hemorrhoids.  Otherwise, normal retroflexed view      of the rectum.  4. Normal esophagus without evidence of Barrett's, mass, erosion,      ulceration, or stricture.  5. Patchy erythema in the body and antrum of the stomach.  She had two      4-6 mm superficial gastric ulcers seen in the antrum in the      prepyloric area.  Biopsies were obtained via cold  forceps to      evaluate for H. pylori gastritis.  6. Multiple patchy erythema in the duodenal bulb with occasional      erosions and significant edema.  7. Normal ampulla.  Moderate bile staining.  Biopsies obtained in the      second portion of the duodenum to evaluate for celiac sprue as an      etiology for her vomiting, abdominal pain, and diarrhea.   DIAGNOSES:  1. No obvious source for diarrhea identified.  2. Vomiting and abdominal pain likely multifactorial.  She has peptic      ulcer disease and duodenitis.  Biopsies were obtained to evaluate      for celiac sprue.   RECOMMENDATIONS:  1. Will call Ms. Manni with the results of her biopsies.  If her      biopsies  do not reveal an etiology for her nausea, vomiting, and      abdominal pain, we will obtain a gastrin level.  2. No aspirin or NSAIDs for 30 days.  No anticoagulation for 7 days.  3. Soft diet for 7 days, then advance to a high-fiber diet.  She was      given a handout on high-fiber diet and a soft mechanical diet.  4. She is also given handouts on polyps and hemorrhoids.  If her polyp      is a simple adenoma, then she should have a screening colonoscopy      in 5 years.  Her first-degree relatives will need colon cancer      screening at age 53 and then every 5 years.  5. She should avoid Mylanta, it is contributing to her loose stools.      She should also avoid dairy products.  6. Follow up with Dr. Cira Servant in 6 weeks.   MEDICATIONS:  1. Demerol 125 mg IV.  2. Versed 8 mg IV.  3. Phenergan 25 mg IV.   PROCEDURE TECHNIQUE:  Physical exam was performed.  Informed consent was  obtained from the patient after explaining the benefits, risks, and  alternatives to the procedure.  The patient was connected to the monitor  and placed in the left lateral position.  Continuous oxygen was provided  by nasal cannula and IV medicine administered through an indwelling  cannula.  After administration of sedation and rectal  exam, the  patient's rectum was intubated, and the scope was advanced under direct  visualization to the distal terminal ileum.  The scope was removed  slowly by carefully examining the color, texture, anatomy, and integrity  of the mucosa on the way out.   After the colonoscopy, the patient's esophagus was intubated with a  diagnostic gastroscope, and the scope was advanced under direct  visualization to the second portion of the duodenum.  The scope was  removed slowly by carefully examining the color, texture, anatomy, and  integrity of the mucosa on the way out.  The patient was recovered in  endoscopy and discharged home in satisfactory condition.   PATH:  H. pylori gastritis/peptic duodenitis are the cause for N/V.  Melanosis coli. Benign colonic biopsies. No adenoma. TCS in 10 years.      Kassie Mends, M.D.  Electronically Signed     SM/MEDQ  D:  03/20/2009  T:  03/21/2009  Job:  [id number]   cc:   Tilda Burrow, M.D.  Fax: [phone]   Mark L. Vear Clock, M.D.  Fax: [phone]

## 2011-05-20 ENCOUNTER — Other Ambulatory Visit (HOSPITAL_COMMUNITY)
Admission: RE | Admit: 2011-05-20 | Discharge: 2011-05-20 | Disposition: A | Discharge status: Home / Self Care | Payer: BC Managed Care – PPO | Source: Ambulatory Visit | Attending: Obstetrics and Gynecology | Admitting: Obstetrics and Gynecology

## 2011-05-20 ENCOUNTER — Other Ambulatory Visit: Payer: Self-pay | Admitting: Obstetrics and Gynecology

## 2011-05-20 ENCOUNTER — Other Ambulatory Visit: Payer: Self-pay | Admitting: Adult Health

## 2011-05-20 DIAGNOSIS — N946 Dysmenorrhea, unspecified: Secondary | ICD-10-CM

## 2011-05-20 DIAGNOSIS — Z113 Encounter for screening for infections with a predominantly sexual mode of transmission: Secondary | ICD-10-CM | POA: Insufficient documentation

## 2011-05-20 DIAGNOSIS — D219 Benign neoplasm of connective and other soft tissue, unspecified: Secondary | ICD-10-CM

## 2011-05-20 DIAGNOSIS — Z01419 Encounter for gynecological examination (general) (routine) without abnormal findings: Secondary | ICD-10-CM | POA: Insufficient documentation

## 2011-05-21 ENCOUNTER — Ambulatory Visit (HOSPITAL_COMMUNITY)
Admission: RE | Admit: 2011-05-21 | Discharge: 2011-05-21 | Disposition: A | Discharge status: Home / Self Care | Payer: BC Managed Care – PPO | Source: Ambulatory Visit | Attending: Obstetrics and Gynecology | Admitting: Obstetrics and Gynecology

## 2011-05-21 ENCOUNTER — Ambulatory Visit (HOSPITAL_COMMUNITY): Admission: RE | Admit: 2011-05-21 | Payer: BC Managed Care – PPO | Source: Ambulatory Visit

## 2011-05-21 DIAGNOSIS — D259 Leiomyoma of uterus, unspecified: Secondary | ICD-10-CM | POA: Insufficient documentation

## 2011-05-21 DIAGNOSIS — N946 Dysmenorrhea, unspecified: Secondary | ICD-10-CM

## 2011-05-21 DIAGNOSIS — R109 Unspecified abdominal pain: Secondary | ICD-10-CM | POA: Insufficient documentation

## 2011-05-21 DIAGNOSIS — N83209 Unspecified ovarian cyst, unspecified side: Secondary | ICD-10-CM | POA: Insufficient documentation

## 2011-05-21 DIAGNOSIS — D219 Benign neoplasm of connective and other soft tissue, unspecified: Secondary | ICD-10-CM

## 2011-05-21 DIAGNOSIS — N920 Excessive and frequent menstruation with regular cycle: Secondary | ICD-10-CM | POA: Insufficient documentation

## 2011-05-29 HISTORY — PX: ENDOMETRIAL ABLATION: SHX621

## 2011-05-30 ENCOUNTER — Emergency Department (HOSPITAL_COMMUNITY)
Admission: EM | Admit: 2011-05-30 | Discharge: 2011-05-30 | Disposition: A | Discharge status: Home / Self Care | Payer: BC Managed Care – PPO | Attending: Emergency Medicine | Admitting: Emergency Medicine

## 2011-05-30 ENCOUNTER — Emergency Department (HOSPITAL_COMMUNITY): Payer: BC Managed Care – PPO

## 2011-05-30 DIAGNOSIS — M25519 Pain in unspecified shoulder: Secondary | ICD-10-CM | POA: Insufficient documentation

## 2011-05-30 DIAGNOSIS — M545 Low back pain, unspecified: Secondary | ICD-10-CM | POA: Insufficient documentation

## 2011-06-10 ENCOUNTER — Other Ambulatory Visit: Payer: Self-pay | Admitting: Infectious Diseases

## 2011-06-10 ENCOUNTER — Other Ambulatory Visit: Payer: Self-pay | Admitting: Obstetrics and Gynecology

## 2011-06-10 ENCOUNTER — Encounter (HOSPITAL_COMMUNITY): Payer: BC Managed Care – PPO

## 2011-06-10 LAB — CBC
HCT: 39.6 % (ref 36.0–46.0)
Hemoglobin: 13.5 g/dL (ref 12.0–15.0)
MCH: 28.4 pg (ref 26.0–34.0)
MCHC: 34.1 g/dL (ref 30.0–36.0)
RDW: 16 % — ABNORMAL HIGH (ref 11.5–15.5)

## 2011-06-10 LAB — HCG, QUANTITATIVE, PREGNANCY: hCG, Beta Chain, Quant, S: 1 m[IU]/mL (ref <5)

## 2011-06-10 LAB — BASIC METABOLIC PANEL
BUN: 9 mg/dL (ref 6–23)
CO2: 25 mEq/L (ref 19–32)
Calcium: 9.8 mg/dL (ref 8.4–10.5)
Creatinine, Ser: 0.63 mg/dL (ref 0.4–1.2)
GFR calc Af Amer: >60 mL/min (ref >60)

## 2011-06-10 LAB — SURGICAL PCR SCREEN: Staphylococcus aureus: NEGATIVE

## 2011-06-16 ENCOUNTER — Ambulatory Visit (HOSPITAL_COMMUNITY)
Admission: RE | Admit: 2011-06-16 | Discharge: 2011-06-16 | Disposition: A | Discharge status: Home / Self Care | Payer: BC Managed Care – PPO | Source: Ambulatory Visit | Attending: Obstetrics and Gynecology | Admitting: Obstetrics and Gynecology

## 2011-06-16 DIAGNOSIS — N920 Excessive and frequent menstruation with regular cycle: Secondary | ICD-10-CM | POA: Insufficient documentation

## 2011-06-16 DIAGNOSIS — Z01812 Encounter for preprocedural laboratory examination: Secondary | ICD-10-CM | POA: Insufficient documentation

## 2011-06-16 DIAGNOSIS — N946 Dysmenorrhea, unspecified: Secondary | ICD-10-CM | POA: Insufficient documentation

## 2011-06-28 NOTE — Op Note (Signed)
  NAMEKALIKA, SMAY NO.:  [id number]  MEDICAL RECORD NO.:  [id number]  LOCATION:  DAYP                          FACILITY:  [facility]  PHYSICIAN:  Tilda Burrow, M.D. DATE OF BIRTH:  January 29, 1961  DATE OF PROCEDURE:  06/16/2011 DATE OF DISCHARGE:                              OPERATIVE REPORT   PREOPERATIVE DIAGNOSES:  Menorrhagia, dysmenorrhea, chronic pain secondary to back problems.  POSTOPERATIVE DIAGNOSES:  Menorrhagia, dysmenorrhea, chronic pain secondary to back problems.  PROCEDURE:  Hysteroscopy, Dilatation and curettage, endometrial ablation.  SURGEON:  Tilda Burrow, MD  ASSISTANT:  None.  ANESTHESIA:  General with laryngeal mask anesthesia.  COMPLICATIONS:  None.  FINDINGS:  Small uterus, anteflexed, sounding to 8 cm, minimal endometrial tissue, previously biopsied and confirmed as normal.  DETAILS OF PROCEDURE:  The patient was taken to the operating room, prepped and draped for vaginal procedure, the time-out conducted, and procedure confirmed by all involved parties.  The cervix was grasped with single tooth tenaculum, sounded to 8 cm, dilated to 23-French. Procedure was notable for the cervix tearing slightly where the tenaculum was attached.  We reamed grasp and continued with the procedure.  Cervix was dilated to 23-French.  The rigid 30-degree hysteroscope inserted.  The endometrial ablation sequence initiated. The Gynecare Thermachoice III endometrial ablation device was inserted and filled with 9 mL of D5W.  The 8-minute thermal ablation sequence completed and followed by removal of the 9 mL of fluid and placement of a paracervical block using 20 mL of D5W.  The single-tooth tenaculum laceration site required a figure-of-eight suture for hemostasis but otherwise procedure was uneventful.  The patient went to recovery room in good condition.     Tilda Burrow, M.D.     JVF/MEDQ  D:  06/16/2011  T:  06/17/2011  Job:   [id number]  cc:   [facility]  Electronically Signed by Christin Bach M.D. on 06/28/2011 10:25:50 PM

## 2011-06-28 NOTE — H&P (Signed)
  NAMETAELYNN, [name] NO.:  [id number]  MEDICAL RECORD NO.:  [id number]  LOCATION:                                 FACILITY:  PHYSICIAN:  Rita [name], M.D. DATE OF BIRTH:  [birth year]  DATE OF ADMISSION: DATE OF DISCHARGE:  LH                             HISTORY & PHYSICAL   ADMITTING DIAGNOSES:  Menorrhagia, mild anemia secondary to heavy menses, desire for endometrial ablation.  HISTORY OF PRESENT ILLNESS:  This is a 50 year old female gravida 2, para 1-0-0-1 status post cesarean section and tubal ligation is admitted for hysteroscopy, D&C, endometrial ablation.  She has been having periods that were unacceptably heavy.  Discussion was conducted in her last prenatal visit regarding the clots and the cramping, and treatment options discussed.  The patient desires proceeding toward endometrial ablation.  The patient has a history of chronic back pain for multiple orthopedic surgeries for which she is on chronic opiates, but she cannot tolerate the discomfort of menses.  PAST MEDICAL HISTORY:  Benign other than chronic pain.  ALLERGIES:  None.  SURGICAL HISTORY:  C-section in 1991, tubal ligation in 1995, left rotator cuff surgery in 2010, neck surgery for spinal fusion.  She had a low back surgery in August 2011.  She has had colonoscopy in 2010, shows personal history of abnormal Pap with spontaneous resolution, back problems chronic pain.  She has had a H. pylori treated in the past. She sees Dr. Vear Clock of [facility] for chronic pain.  FAMILY HISTORY:  Positive for MI in her father with also history of hypertension, diabetes, strokes.  She smokes.  She has no recreational drugs.  No alcohol.  Cigarette use is less than one pack per day.  PHYSICAL EXAMINATION:  GENERAL:  She is a healthy-appearing Caucasian female, weight 160.4, blood pressure 122/80, pulse 80.  Hemoglobin 11. Urine shows blood as she has been getting her  menses. HEENT:  Pupils are equal, round, and reactive.  Extraocular movements are intact. NECK:  Supple. CHEST:  Clear to auscultation. ABDOMEN:  Nontender without masses. EXTERNAL GENITALIA:  Multiparous.  Vaginal exam normal support.  Uterus sounds to 8 cm and endometrial biopsy was performed under paracervical block, which showed normal endometrial tissues with no hyperplasia or cancer.  Adnexa was normal without masses.  IMPRESSION:  Menorrhagia desiring endometrial ablation.  PLAN:  Hysteroscopy, D&C, endometrial ablation on June 16, 2011.     Rita [name], M.D.     JVF/MEDQ  D:  06/15/2011  T:  06/16/2011  Job:  [id number]  cc:   [facility]  Electronically Signed by Rita [name] M.D. on 06/28/2011 10:25:41 PM

## 2011-07-02 ENCOUNTER — Emergency Department (HOSPITAL_COMMUNITY): Payer: BC Managed Care – PPO

## 2011-07-02 ENCOUNTER — Encounter (HOSPITAL_COMMUNITY): Payer: Self-pay | Admitting: Radiology

## 2011-07-02 ENCOUNTER — Emergency Department (HOSPITAL_COMMUNITY)
Admission: EM | Admit: 2011-07-02 | Discharge: 2011-07-03 | Disposition: A | Discharge status: Home / Self Care | Payer: BC Managed Care – PPO | Attending: Emergency Medicine | Admitting: Emergency Medicine

## 2011-07-02 DIAGNOSIS — F410 Panic disorder [episodic paroxysmal anxiety] without agoraphobia: Secondary | ICD-10-CM | POA: Insufficient documentation

## 2011-07-02 DIAGNOSIS — Z79899 Other long term (current) drug therapy: Secondary | ICD-10-CM | POA: Insufficient documentation

## 2011-07-02 DIAGNOSIS — F3289 Other specified depressive episodes: Secondary | ICD-10-CM | POA: Insufficient documentation

## 2011-07-02 DIAGNOSIS — M549 Dorsalgia, unspecified: Secondary | ICD-10-CM | POA: Insufficient documentation

## 2011-07-02 DIAGNOSIS — G8929 Other chronic pain: Secondary | ICD-10-CM | POA: Insufficient documentation

## 2011-07-02 DIAGNOSIS — F329 Major depressive disorder, single episode, unspecified: Secondary | ICD-10-CM | POA: Insufficient documentation

## 2011-07-02 DIAGNOSIS — R1032 Left lower quadrant pain: Secondary | ICD-10-CM | POA: Insufficient documentation

## 2011-07-02 DIAGNOSIS — R635 Abnormal weight gain: Secondary | ICD-10-CM | POA: Insufficient documentation

## 2011-07-02 MED ORDER — IOHEXOL 300 MG/ML  SOLN
100.0000 mL | Freq: Once | INTRAMUSCULAR | Status: AC | PRN
  Administered 2011-07-02: 100 mL via INTRAVENOUS

## 2011-07-03 LAB — CBC
Hemoglobin: 11.7 g/dL — ABNORMAL LOW (ref 12.0–15.0)
MCHC: 34.2 g/dL (ref 30.0–36.0)
Platelets: 159 10*3/uL (ref 150–400)
RBC: 4.09 MIL/uL (ref 3.87–5.11)

## 2011-07-03 LAB — COMPREHENSIVE METABOLIC PANEL
ALT: 10 U/L (ref 0–35)
AST: 13 U/L (ref 0–37)
Alkaline Phosphatase: 60 U/L (ref 39–117)
CO2: 25 mEq/L (ref 19–32)
Calcium: 8.7 mg/dL (ref 8.4–10.5)
GFR calc Af Amer: >60 mL/min (ref >60)
Glucose, Bld: 96 mg/dL (ref 70–99)
Potassium: 3.7 mEq/L (ref 3.5–5.1)
Sodium: 132 mEq/L — ABNORMAL LOW (ref 135–145)
Total Protein: 7 g/dL (ref 6.0–8.3)

## 2011-07-03 LAB — URINALYSIS, ROUTINE W REFLEX MICROSCOPIC
Bilirubin Urine: NEGATIVE
Leukocytes, UA: NEGATIVE
Nitrite: NEGATIVE
Specific Gravity, Urine: 1.015 (ref 1.005–1.030)
Urobilinogen, UA: 0.2 mg/dL (ref 0.0–1.0)
pH: 5.5 (ref 5.0–8.0)

## 2011-07-03 LAB — DIFFERENTIAL
Basophils Absolute: 0 10*3/uL (ref 0.0–0.1)
Basophils Relative: 0 % (ref 0–1)
Eosinophils Absolute: 0.2 10*3/uL (ref 0.0–0.7)
Neutro Abs: 7.5 10*3/uL (ref 1.7–7.7)
Neutrophils Relative %: 63 % (ref 43–77)

## 2011-07-03 LAB — URINE MICROSCOPIC-ADD ON

## 2011-09-22 ENCOUNTER — Encounter (HOSPITAL_COMMUNITY): Payer: Self-pay | Admitting: *Deleted

## 2011-09-22 ENCOUNTER — Inpatient Hospital Stay (HOSPITAL_COMMUNITY)
Admission: AD | Admit: 2011-09-22 | Discharge: 2011-09-22 | Disposition: A | Discharge status: Home / Self Care | Payer: Medicare Other | Source: Ambulatory Visit | Attending: Obstetrics & Gynecology | Admitting: Obstetrics & Gynecology

## 2011-09-22 DIAGNOSIS — N921 Excessive and frequent menstruation with irregular cycle: Secondary | ICD-10-CM

## 2011-09-22 DIAGNOSIS — R102 Pelvic and perineal pain: Secondary | ICD-10-CM

## 2011-09-22 DIAGNOSIS — N949 Unspecified condition associated with female genital organs and menstrual cycle: Secondary | ICD-10-CM | POA: Insufficient documentation

## 2011-09-22 DIAGNOSIS — N920 Excessive and frequent menstruation with regular cycle: Secondary | ICD-10-CM | POA: Insufficient documentation

## 2011-09-22 HISTORY — DX: Low back pain, unspecified: M54.50

## 2011-09-22 HISTORY — DX: Low back pain: M54.5

## 2011-09-22 HISTORY — DX: Migraine, unspecified, not intractable, without status migrainosus: G43.909

## 2011-09-22 HISTORY — DX: Other chronic pain: G89.29

## 2011-09-22 HISTORY — DX: Major depressive disorder, single episode, unspecified: F32.9

## 2011-09-22 HISTORY — DX: Depression, unspecified: F32.A

## 2011-09-22 LAB — URINALYSIS, ROUTINE W REFLEX MICROSCOPIC
Glucose, UA: NEGATIVE mg/dL
Ketones, ur: NEGATIVE mg/dL
Leukocytes, UA: NEGATIVE
Nitrite: NEGATIVE
Specific Gravity, Urine: <1.005 — ABNORMAL LOW (ref 1.005–1.030)
pH: 6 (ref 5.0–8.0)

## 2011-09-22 LAB — CBC
Hemoglobin: 13.8 g/dL (ref 12.0–15.0)
MCH: 29.5 pg (ref 26.0–34.0)
Platelets: 154 10*3/uL (ref 150–400)
RBC: 4.68 MIL/uL (ref 3.87–5.11)
WBC: 8 10*3/uL (ref 4.0–10.5)

## 2011-09-22 LAB — RAPID URINE DRUG SCREEN, HOSP PERFORMED
Amphetamines: NOT DETECTED
Benzodiazepines: NOT DETECTED
Opiates: NOT DETECTED

## 2011-09-22 LAB — POCT PREGNANCY, URINE: Preg Test, Ur: NEGATIVE

## 2011-09-22 MED ORDER — KETOROLAC TROMETHAMINE 60 MG/2ML IM SOLN
60.0000 mg | Freq: Once | INTRAMUSCULAR | Status: AC
  Administered 2011-09-22: 60 mg via INTRAMUSCULAR
  Filled 2011-09-22 (×2): qty 2

## 2011-09-22 NOTE — ED Provider Notes (Signed)
Attestation of Attending Supervision of Advanced Practitioner: Evaluation and management procedures were performed by the PA/NP/CNM/OB Fellow under my supervision/collaboration. Chart reviewed and agree with management and plan.  Carleigh Buccieri A 09/22/2011 12:01 PM   

## 2011-09-22 NOTE — ED Provider Notes (Signed)
History   Pt presents today c/o severe lower abd pain and heavy vag bleeding. She had an endometrial ablation in 05/2011 by Dr. Emelda Fear. She states she has continued to bleed and she thinks it is getting heavier. She was at the pain clinic this morning and was instructed to come to the MAU for evaluation.  Chief Complaint  Patient presents with  . Hip Pain   HPI  OB History    Grav Para Term Preterm Abortions TAB SAB Ect Mult Living   1 1 1       1       Past Medical History  Diagnosis Date  . Chronic low back pain   . Depression   . Migraine headache     Past Surgical History  Procedure Date  . Cervical spine surgery   . Rotator cuff repair   . Back surgery   . Colonoscopy   . Endometrial ablation     No family history on file.  History  Substance Use Topics  . Smoking status: Current Everyday Smoker -- 0.2 packs/day for 20 years  . Smokeless tobacco: Not on file  . Alcohol Use: No    Allergies: No Known Allergies  Prescriptions prior to admission  Medication Sig Dispense Refill  . diazepam (VALIUM) 5 MG tablet Take 5 mg by mouth every 6 (six) hours as needed. For spasms.       . DULoxetine (CYMBALTA) 60 MG capsule Take 60 mg by mouth daily.        . OXYCODONE HCL PO Take 1 tablet by mouth 6 (six) times daily. For pain.         Review of Systems  Constitutional: Negative for fever.  Cardiovascular: Negative for chest pain.  Gastrointestinal: Positive for abdominal pain. Negative for nausea, vomiting, diarrhea and constipation.  Genitourinary: Negative for dysuria, urgency, frequency and hematuria.  Neurological: Negative for dizziness and headaches.  Psychiatric/Behavioral: Negative for depression and suicidal ideas.   Physical Exam   Blood pressure 129/74, pulse 89, temperature 97.7 F (36.5 C), temperature source Oral, resp. rate 20, height 5\' 2"  (1.575 m), weight 160 lb (72.576 kg), last menstrual period 09/16/2011.  Physical Exam  Constitutional: She  is oriented to person, place, and time. She appears well-developed and well-nourished. No distress.  HENT:  Head: Normocephalic and atraumatic.  Eyes: EOM are normal. Pupils are equal, round, and reactive to light.  GI: Soft. She exhibits no distension and no mass. There is tenderness (When pt was distracted with conversation, she exhibited no signs of pain with deep palpation of the abd and pelvis.). There is no rebound and no guarding.  Genitourinary: There is bleeding around the vagina. Vaginal discharge found.       Minimal amount of bleeding noted on exam. Uterus slightly tender. No adnexal masses noted.  Neurological: She is alert and oriented to person, place, and time.  Skin: Skin is warm and dry. She is not diaphoretic.    MAU Course  Procedures  Results for orders placed during the hospital encounter of 09/22/11 (from the past 24 hour(s))  CBC     Status: Normal   Collection Time   09/22/11 10:20 AM      Component Value Range   WBC 8.0  4.0 - 10.5 (K/uL)   RBC 4.68  3.87 - 5.11 (MIL/uL)   Hemoglobin 13.8  12.0 - 15.0 (g/dL)   HCT 91.4  78.2 - 95.6 (%)   MCV 87.0  78.0 - 100.0 (fL)  MCH 29.5  26.0 - 34.0 (pg)   MCHC 33.9  30.0 - 36.0 (g/dL)   RDW 40.9  81.1 - 91.4 (%)   Platelets 154  150 - 400 (K/uL)  URINALYSIS, ROUTINE W REFLEX MICROSCOPIC     Status: Abnormal   Collection Time   09/22/11 10:38 AM      Component Value Range   Color, Urine STRAW (*) YELLOW    Appearance CLEAR  CLEAR    Specific Gravity, Urine <1.005 (*) 1.005 - 1.030    pH 6.0  5.0 - 8.0    Glucose, UA NEGATIVE  NEGATIVE (mg/dL)   Hgb urine dipstick MODERATE (*) NEGATIVE    Bilirubin Urine NEGATIVE  NEGATIVE    Ketones, ur NEGATIVE  NEGATIVE (mg/dL)   Protein, ur NEGATIVE  NEGATIVE (mg/dL)   Urobilinogen, UA 0.2  0.0 - 1.0 (mg/dL)   Nitrite NEGATIVE  NEGATIVE    Leukocytes, UA NEGATIVE  NEGATIVE   URINE MICROSCOPIC-ADD ON     Status: Normal   Collection Time   09/22/11 10:38 AM      Component  Value Range   Squamous Epithelial / LPF RARE  RARE    RBC / HPF 0-2  <3 (RBC/hpf)  POCT PREGNANCY, URINE     Status: Normal   Collection Time   09/22/11 10:41 AM      Component Value Range   Preg Test, Ur NEGATIVE     Toradol injection to be given.  Assessment and Plan  Menometrorrhagia/pelvic pain: discussed with pt at length. She already has pain medication. She has f/u scheduled with Dr. Emelda Fear. Discussed diet, activity, risks, and precautions.  Clinton Gallant. Rice III, DrHSc, MPAS, PA-C  09/22/2011, 10:46 AM   Henrietta Hoover, PA 09/22/11 1104

## 2011-09-22 NOTE — ED Notes (Signed)
Pt states she continues to have periods despite the ablation.

## 2011-09-22 NOTE — Progress Notes (Signed)
Pt had endometrial ablation in June by Dr. Emelda Fear @ [facility].  A week later pt had infection, was given cream by Dr. Emelda Fear.  Lower abd, hip & back pain was present before ablation, has become worse since & is now unbearable.  Periods are also heavier & last longer since ablation.  Pt has been seen @ pain clinic since 2007 for chronic back & neck pain, states dr @ clinic has also been trying to help her with her abd & pelvic pain.  Was referred here this a.m. By pain clinic.

## 2011-10-13 ENCOUNTER — Encounter (HOSPITAL_COMMUNITY): Payer: Self-pay

## 2011-10-13 ENCOUNTER — Encounter (HOSPITAL_COMMUNITY)
Admission: RE | Admit: 2011-10-13 | Discharge: 2011-10-13 | Disposition: A | Discharge status: Home / Self Care | Payer: BC Managed Care – PPO | Source: Ambulatory Visit | Attending: Obstetrics and Gynecology | Admitting: Obstetrics and Gynecology

## 2011-10-13 ENCOUNTER — Other Ambulatory Visit: Payer: Self-pay | Admitting: Obstetrics and Gynecology

## 2011-10-13 LAB — CBC
HCT: 39.2 % (ref 36.0–46.0)
MCHC: 34.2 g/dL (ref 30.0–36.0)
Platelets: 159 10*3/uL (ref 150–400)
RDW: 14.6 % (ref 11.5–15.5)
WBC: 8 10*3/uL (ref 4.0–10.5)

## 2011-10-13 LAB — URINALYSIS, ROUTINE W REFLEX MICROSCOPIC
Glucose, UA: NEGATIVE mg/dL
Ketones, ur: NEGATIVE mg/dL
Leukocytes, UA: NEGATIVE
Nitrite: NEGATIVE
Protein, ur: NEGATIVE mg/dL
Urobilinogen, UA: 0.2 mg/dL (ref 0.0–1.0)

## 2011-10-13 LAB — BASIC METABOLIC PANEL
BUN: 7 mg/dL (ref 6–23)
Calcium: 9.2 mg/dL (ref 8.4–10.5)
Chloride: 101 mEq/L (ref 96–112)
Creatinine, Ser: 0.57 mg/dL (ref 0.50–1.10)
GFR calc Af Amer: >90 mL/min (ref >90)
GFR calc non Af Amer: >90 mL/min (ref >90)

## 2011-10-13 LAB — SURGICAL PCR SCREEN: MRSA, PCR: NEGATIVE

## 2011-10-13 LAB — HCG, SERUM, QUALITATIVE: Preg, Serum: NEGATIVE

## 2011-10-13 MED ORDER — MAGNESIUM CITRATE PO SOLN: 300.0000 mL | Freq: Once | ORAL | Status: DC

## 2011-10-13 NOTE — Patient Instructions (Signed)
20 Rita [name]  10/13/2011   Your procedure is scheduled on:  10/20/11  Report to [facility] at 09:10 AM.  Call this number if you have problems the morning of surgery: [phone]   Remember:   Do not eat food:After Midnight.  Do not drink clear liquids: After Midnight.  Take these medicines the morning of surgery with A SIP OF WATER: valium, cymbalta, pain pill if needed   Do not wear jewelry, make-up or nail polish.  Do not wear lotions, powders, or perfumes. You may wear deodorant.  Do not shave 48 hours prior to surgery.  Do not bring valuables to the hospital.  Contacts, dentures or bridgework may not be worn into surgery.  Leave suitcase in the car. After surgery it may be brought to your room.  For patients admitted to the hospital, checkout time is 11:00 AM the day of discharge.   Patients discharged the day of surgery will not be allowed to drive home.  Name and phone number of your driver: driver  Special Instructions: CHG Shower Use Special Wash: 1/2 bottle night before surgery and 1/2 bottle morning of surgery.   Please read over the following fact sheets that you were given: Pain Booklet, MRSA Information, Surgical Site Infection Prevention, Anesthesia Post-op Instructions and Care and Recovery After Surgery   Hysterectomy, Abdominal & Vaginal Care After Refer to this sheet in the next few weeks. These discharge instructions provide you with general information on caring for yourself after you leave the hospital. Your caregiver may also give you specific instructions. Your treatment has been planned according to the most current medical practices available, but unavoidable complications sometimes occur. If you have any problems or questions after discharge, please call your caregiver. HOME CARE INSTRUCTIONS Healing will take time. You will have tenderness at the surgery site. There may be some swelling and bruising around this area if you had an abdominal hysterectomy.  Have an adult stay with you the first 48 to 72 hours after surgery, and then for 1 to 2 weeks afterward to help with daily activities.  Only take over-the-counter or prescription medicines for pain, discomfort, or fever as directed by your caregiver.   Do not take aspirin. It can cause bleeding.   Do not drive when taking pain medicine.   It will be normal to be sore for a couple weeks after surgery. See your caregiver if this seems to be getting worse rather than better.   Follow your caregiver's advice regarding diet, exercise, lifting, driving, and general activities.   Take showers instead of baths for a few weeks as directed.   You may resume your usual diet as directed.   Get plenty of rest and sleep.   Do not douche, use tampons, or have sexual intercourse until your caregiver says it is okay.   Change your bandages (dressings) as directed if you had an abdominal hysterectomy.   Take your temperature twice a day and write it down.   Do not drink alcohol until your caregiver says it is okay.   If you develop constipation, you may take a mild laxative with your caregiver's permission. Eating bran foods helps with constipation problems. Drink enough water and fluids to keep your urine clear or pale yellow.   Do not sign any legal documents until you feel normal again.   Keep all of your follow-up appointments.   Make sure you and your family understand everything about your operation and recovery.  SEEK MEDICAL  CARE IF:  There is swelling, redness, or increasing pain in the wound area.   Fluid (pus) is coming from the wound.   You notice a bad smell coming from the wound or surgical dressing.   You have pain, redness, and swelling from the intravenous (IV) site.   The wound breaks open.   You feel dizzy.   You develop pain or bleeding when you urinate.   You develop diarrhea.   You feel sick to your stomach (nauseous) and throw up (vomit).   You develop  abnormal vaginal discharge.   You develop a rash.   You have any type of abnormal reaction or develop an allergy to your medicine.   Your pain medicine is not relieving your pain.  seek immediate medical care if:  You have an oral temperature above 100.4, not controlled by medicine.   You develop belly (abdominal) pain.   You develop chest pain.   You develop shortness of breath.   You pass out (faint).   You develop pain, swelling, or redness of your leg.   You develop heavy vaginal bleeding, with or without blood clots.  MAKE SURE YOU:  Understand these instructions.   Will watch your condition.   Will get help right away if you are not doing well or get worse.  Document Released: 03/05/2004 Document Re-Released: 06/03/2010 [facility] Patient Information 2011 [place], [place].

## 2011-10-20 ENCOUNTER — Encounter (HOSPITAL_COMMUNITY): Payer: Self-pay | Admitting: *Deleted

## 2011-10-20 ENCOUNTER — Encounter (HOSPITAL_COMMUNITY): Payer: Self-pay | Admitting: Anesthesiology

## 2011-10-20 ENCOUNTER — Inpatient Hospital Stay (HOSPITAL_COMMUNITY)
Admission: RE | Admit: 2011-10-20 | Discharge: 2011-10-23 | DRG: 359 | Disposition: A | Discharge status: Home / Self Care | Payer: BC Managed Care – PPO | Source: Ambulatory Visit | Attending: Obstetrics and Gynecology | Admitting: Obstetrics and Gynecology

## 2011-10-20 ENCOUNTER — Inpatient Hospital Stay (HOSPITAL_COMMUNITY): Payer: BC Managed Care – PPO | Admitting: Anesthesiology

## 2011-10-20 ENCOUNTER — Encounter (HOSPITAL_COMMUNITY)
Admission: RE | Disposition: A | Discharge status: Home / Self Care | Payer: Self-pay | Source: Ambulatory Visit | Attending: Obstetrics and Gynecology

## 2011-10-20 ENCOUNTER — Other Ambulatory Visit: Payer: Self-pay | Admitting: Obstetrics and Gynecology

## 2011-10-20 DIAGNOSIS — N946 Dysmenorrhea, unspecified: Principal | ICD-10-CM | POA: Diagnosis present

## 2011-10-20 DIAGNOSIS — N944 Primary dysmenorrhea: Secondary | ICD-10-CM

## 2011-10-20 DIAGNOSIS — G8929 Other chronic pain: Secondary | ICD-10-CM | POA: Diagnosis present

## 2011-10-20 DIAGNOSIS — L905 Scar conditions and fibrosis of skin: Secondary | ICD-10-CM | POA: Diagnosis present

## 2011-10-20 HISTORY — PX: SCAR REVISION: SHX5285

## 2011-10-20 HISTORY — PX: ABDOMINAL HYSTERECTOMY: SHX81

## 2011-10-20 SURGERY — HYSTERECTOMY, ABDOMINAL
Anesthesia: General | Wound class: Clean Contaminated

## 2011-10-20 MED ORDER — KETOROLAC TROMETHAMINE 30 MG/ML IJ SOLN
30.0000 mg | Freq: Four times a day (QID) | INTRAMUSCULAR | Status: AC
  Administered 2011-10-20 – 2011-10-21 (×5): 30 mg via INTRAVENOUS
  Filled 2011-10-20 (×5): qty 1

## 2011-10-20 MED ORDER — OXYCODONE HCL 15 MG PO TB12
15.0000 mg | ORAL_TABLET | Freq: Four times a day (QID) | ORAL | Status: DC
  Administered 2011-10-20 – 2011-10-22 (×7): 15 mg via ORAL
  Filled 2011-10-20 (×7): qty 1

## 2011-10-20 MED ORDER — BUPIVACAINE HCL (PF) 0.25 % IJ SOLN
INTRAMUSCULAR | Status: DC | PRN
  Administered 2011-10-20: 18 mL

## 2011-10-20 MED ORDER — LACTATED RINGERS IV SOLN
INTRAVENOUS | Status: DC
  Administered 2011-10-20 (×2): via INTRAVENOUS

## 2011-10-20 MED ORDER — NALOXONE HCL 0.4 MG/ML IJ SOLN: 0.4000 mg | INTRAMUSCULAR | Status: DC | PRN

## 2011-10-20 MED ORDER — LIDOCAINE HCL 1 % IJ SOLN
INTRAMUSCULAR | Status: DC | PRN
  Administered 2011-10-20: 20 mg via INTRADERMAL

## 2011-10-20 MED ORDER — FENTANYL CITRATE 0.05 MG/ML IJ SOLN
50.0000 ug | INTRAMUSCULAR | Status: DC | PRN
  Administered 2011-10-20 (×4): 50 ug via INTRAVENOUS

## 2011-10-20 MED ORDER — CEFAZOLIN SODIUM 1-5 GM-% IV SOLN
1.0000 g | INTRAVENOUS | Status: AC
  Administered 2011-10-20: 1 g via INTRAVENOUS

## 2011-10-20 MED ORDER — KCL IN DEXTROSE-NACL 20-5-0.45 MEQ/L-%-% IV SOLN
INTRAVENOUS | Status: DC
  Administered 2011-10-20 – 2011-10-22 (×3): via INTRAVENOUS

## 2011-10-20 MED ORDER — MIDAZOLAM HCL 2 MG/2ML IJ SOLN
INTRAMUSCULAR | Status: AC
  Administered 2011-10-20: 2 mg via INTRAVENOUS
  Filled 2011-10-20: qty 2

## 2011-10-20 MED ORDER — OXYCODONE HCL 15 MG PO TB12
15.0000 mg | ORAL_TABLET | ORAL | Status: DC
  Filled 2011-10-20: qty 1

## 2011-10-20 MED ORDER — GLYCOPYRROLATE 0.2 MG/ML IJ SOLN
INTRAMUSCULAR | Status: DC | PRN
  Administered 2011-10-20: .4 mg via INTRAVENOUS

## 2011-10-20 MED ORDER — MIDAZOLAM HCL 2 MG/2ML IJ SOLN
1.0000 mg | INTRAMUSCULAR | Status: DC | PRN
  Administered 2011-10-20: 2 mg via INTRAVENOUS

## 2011-10-20 MED ORDER — CEFAZOLIN SODIUM 1-5 GM-% IV SOLN
INTRAVENOUS | Status: AC
  Filled 2011-10-20: qty 50

## 2011-10-20 MED ORDER — ONDANSETRON HCL 4 MG/2ML IJ SOLN: 4.0000 mg | Freq: Four times a day (QID) | INTRAMUSCULAR | Status: DC | PRN

## 2011-10-20 MED ORDER — SUCCINYLCHOLINE CHLORIDE 20 MG/ML IJ SOLN
INTRAMUSCULAR | Status: DC | PRN
  Administered 2011-10-20: 140 mg via INTRAVENOUS

## 2011-10-20 MED ORDER — PROPOFOL 10 MG/ML IV EMUL
INTRAVENOUS | Status: AC
  Filled 2011-10-20: qty 20

## 2011-10-20 MED ORDER — PANTOPRAZOLE SODIUM 40 MG PO TBEC
40.0000 mg | DELAYED_RELEASE_TABLET | Freq: Every day | ORAL | Status: DC
  Administered 2011-10-21 – 2011-10-22 (×2): 40 mg via ORAL
  Filled 2011-10-20: qty 1

## 2011-10-20 MED ORDER — SODIUM CHLORIDE 0.9 % IR SOLN
Status: DC | PRN
  Administered 2011-10-20: 2000 mL

## 2011-10-20 MED ORDER — NEOSTIGMINE METHYLSULFATE 1 MG/ML IJ SOLN
INTRAMUSCULAR | Status: AC
  Filled 2011-10-20: qty 10

## 2011-10-20 MED ORDER — FENTANYL CITRATE 0.05 MG/ML IJ SOLN
INTRAMUSCULAR | Status: AC
  Administered 2011-10-20: 50 ug via INTRAVENOUS
  Filled 2011-10-20: qty 2

## 2011-10-20 MED ORDER — HYDROMORPHONE 0.3 MG/ML IV SOLN
INTRAVENOUS | Status: AC
  Filled 2011-10-20: qty 25

## 2011-10-20 MED ORDER — FENTANYL CITRATE 0.05 MG/ML IJ SOLN: 25.0000 ug | INTRAMUSCULAR | Status: DC | PRN

## 2011-10-20 MED ORDER — ZOLPIDEM TARTRATE 5 MG PO TABS
5.0000 mg | ORAL_TABLET | Freq: Every evening | ORAL | Status: DC | PRN
  Administered 2011-10-20 – 2011-10-23 (×2): 10 mg via ORAL
  Filled 2011-10-20 (×2): qty 2

## 2011-10-20 MED ORDER — ROCURONIUM BROMIDE 50 MG/5ML IV SOLN
INTRAVENOUS | Status: AC
  Filled 2011-10-20: qty 1

## 2011-10-20 MED ORDER — GLYCOPYRROLATE 0.2 MG/ML IJ SOLN
INTRAMUSCULAR | Status: AC
  Filled 2011-10-20: qty 1

## 2011-10-20 MED ORDER — OXYCODONE-ACETAMINOPHEN 5-325 MG PO TABS
1.0000 | ORAL_TABLET | ORAL | Status: DC | PRN
  Administered 2011-10-22 (×2): 2 via ORAL
  Filled 2011-10-20 (×2): qty 2

## 2011-10-20 MED ORDER — HYDROMORPHONE 0.3 MG/ML IV SOLN
INTRAVENOUS | Status: DC
  Administered 2011-10-21: 2.24 mg via INTRAVENOUS
  Administered 2011-10-21: 7.5 mg via INTRAVENOUS
  Administered 2011-10-21: 12:00:00 via INTRAVENOUS
  Administered 2011-10-21: 2.94 mg via INTRAVENOUS
  Administered 2011-10-22: 23 mL via INTRAVENOUS
  Administered 2011-10-22: 2 mg via INTRAVENOUS
  Administered 2011-10-22: 4 mg via INTRAVENOUS

## 2011-10-20 MED ORDER — DULOXETINE HCL 60 MG PO CPEP
60.0000 mg | ORAL_CAPSULE | Freq: Every day | ORAL | Status: DC
  Administered 2011-10-21 – 2011-10-22 (×2): 60 mg via ORAL
  Filled 2011-10-20 (×2): qty 1

## 2011-10-20 MED ORDER — DIPHENHYDRAMINE HCL 50 MG/ML IJ SOLN: 12.5000 mg | Freq: Four times a day (QID) | INTRAMUSCULAR | Status: DC | PRN

## 2011-10-20 MED ORDER — BUPIVACAINE HCL (PF) 0.25 % IJ SOLN
INTRAMUSCULAR | Status: AC
  Filled 2011-10-20: qty 30

## 2011-10-20 MED ORDER — ONDANSETRON HCL 4 MG/2ML IJ SOLN
4.0000 mg | Freq: Once | INTRAMUSCULAR | Status: AC | PRN
  Administered 2011-10-20: 4 mg via INTRAVENOUS

## 2011-10-20 MED ORDER — ONDANSETRON HCL 4 MG/2ML IJ SOLN
INTRAMUSCULAR | Status: AC
  Administered 2011-10-20: 4 mg via INTRAVENOUS
  Filled 2011-10-20: qty 2

## 2011-10-20 MED ORDER — SODIUM CHLORIDE 0.9 % IJ SOLN
INTRAMUSCULAR | Status: AC
  Filled 2011-10-20: qty 10

## 2011-10-20 MED ORDER — SODIUM CHLORIDE 0.9 % IJ SOLN: 9.0000 mL | INTRAMUSCULAR | Status: DC | PRN

## 2011-10-20 MED ORDER — KETOROLAC TROMETHAMINE 30 MG/ML IJ SOLN: 30.0000 mg | Freq: Four times a day (QID) | INTRAMUSCULAR | Status: AC

## 2011-10-20 MED ORDER — HYDROMORPHONE 0.3 MG/ML IV SOLN
INTRAVENOUS | Status: AC
  Administered 2011-10-20: 21:00:00
  Filled 2011-10-20: qty 25

## 2011-10-20 MED ORDER — ROCURONIUM BROMIDE 100 MG/10ML IV SOLN
INTRAVENOUS | Status: DC | PRN
  Administered 2011-10-20 (×2): 10 mg via INTRAVENOUS
  Administered 2011-10-20: 5 mg via INTRAVENOUS
  Administered 2011-10-20: 35 mg via INTRAVENOUS

## 2011-10-20 MED ORDER — NEOSTIGMINE METHYLSULFATE 1 MG/ML IJ SOLN
INTRAMUSCULAR | Status: DC | PRN
  Administered 2011-10-20: 2 mg via INTRAVENOUS

## 2011-10-20 MED ORDER — SUFENTANIL CITRATE 50 MCG/ML IV SOLN
INTRAVENOUS | Status: DC | PRN
  Administered 2011-10-20 (×3): 10 ug via INTRAVENOUS
  Administered 2011-10-20: 5 ug via INTRAVENOUS
  Administered 2011-10-20: 10 ug via INTRAVENOUS
  Administered 2011-10-20: 5 ug via INTRAVENOUS

## 2011-10-20 MED ORDER — SUFENTANIL CITRATE 50 MCG/ML IV SOLN
INTRAVENOUS | Status: AC
  Filled 2011-10-20: qty 1

## 2011-10-20 MED ORDER — LIDOCAINE HCL (PF) 1 % IJ SOLN
INTRAMUSCULAR | Status: AC
  Filled 2011-10-20: qty 5

## 2011-10-20 MED ORDER — DIPHENHYDRAMINE HCL 12.5 MG/5ML PO ELIX: 12.5000 mg | ORAL_SOLUTION | Freq: Four times a day (QID) | ORAL | Status: DC | PRN

## 2011-10-20 MED ORDER — SUCCINYLCHOLINE CHLORIDE 20 MG/ML IJ SOLN
INTRAMUSCULAR | Status: AC
  Filled 2011-10-20: qty 1

## 2011-10-20 MED ORDER — PROPOFOL 10 MG/ML IV EMUL
INTRAVENOUS | Status: DC | PRN
  Administered 2011-10-20: 180 mg via INTRAVENOUS

## 2011-10-20 MED ORDER — PROMETHAZINE HCL 25 MG/ML IJ SOLN: 12.5000 mg | INTRAMUSCULAR | Status: DC | PRN

## 2011-10-20 SURGICAL SUPPLY — 65 items
APPLIER CLIP 11 MED OPEN (CLIP)
APPLIER CLIP 13 LRG OPEN (CLIP)
BAG HAMPER (MISCELLANEOUS) ×2 IMPLANT
BENZOIN TINCTURE PRP APPL 2/3 (GAUZE/BANDAGES/DRESSINGS) ×2 IMPLANT
CELLS DAT CNTRL 66122 CELL SVR (MISCELLANEOUS) IMPLANT
CLIP APPLIE 11 MED OPEN (CLIP) IMPLANT
CLIP APPLIE 13 LRG OPEN (CLIP) IMPLANT
CLOTH BEACON ORANGE TIMEOUT ST (SAFETY) ×2 IMPLANT
COVER LIGHT HANDLE STERIS (MISCELLANEOUS) ×4 IMPLANT
DECANTER SPIKE VIAL GLASS SM (MISCELLANEOUS) ×2 IMPLANT
DRAPE WARM FLUID 44X44 (DRAPE) ×2 IMPLANT
DRESSING TELFA 8X3 (GAUZE/BANDAGES/DRESSINGS) IMPLANT
ELECT REM PT RETURN 9FT ADLT (ELECTROSURGICAL) ×2
ELECTRODE REM PT RTRN 9FT ADLT (ELECTROSURGICAL) ×1 IMPLANT
EVACUATOR DRAINAGE 10X20 100CC (DRAIN) IMPLANT
EVACUATOR SILICONE 100CC (DRAIN)
FORMALIN 10 PREFIL 480ML (MISCELLANEOUS) ×2 IMPLANT
GLOVE BIOGEL PI IND STRL 7.0 (GLOVE) ×1 IMPLANT
GLOVE BIOGEL PI IND STRL 7.5 (GLOVE) ×1 IMPLANT
GLOVE BIOGEL PI INDICATOR 7.0 (GLOVE) ×1
GLOVE BIOGEL PI INDICATOR 7.5 (GLOVE) ×1
GLOVE ECLIPSE 6.5 STRL STRAW (GLOVE) ×2 IMPLANT
GLOVE ECLIPSE 7.0 STRL STRAW (GLOVE) ×2 IMPLANT
GLOVE ECLIPSE 9.0 STRL (GLOVE) ×2 IMPLANT
GLOVE EXAM NITRILE MD LF STRL (GLOVE) ×2 IMPLANT
GLOVE INDICATOR STER SZ 9 (GLOVE) ×2 IMPLANT
GOWN BRE IMP SLV AUR XL STRL (GOWN DISPOSABLE) ×4 IMPLANT
GOWN STRL REIN 3XL LVL4 (GOWN DISPOSABLE) ×2 IMPLANT
INST SET MAJOR GENERAL (KITS) ×2 IMPLANT
KIT ROOM TURNOVER APOR (KITS) ×2 IMPLANT
MANIFOLD NEPTUNE II (INSTRUMENTS) ×2 IMPLANT
NEEDLE HYPO 25X1 1.5 SAFETY (NEEDLE) ×2 IMPLANT
NS IRRIG 1000ML POUR BTL (IV SOLUTION) ×6 IMPLANT
PACK ABDOMINAL MAJOR (CUSTOM PROCEDURE TRAY) ×2 IMPLANT
PAD ABD 5X9 TENDERSORB (GAUZE/BANDAGES/DRESSINGS) ×2 IMPLANT
PAD ARMBOARD 7.5X6 YLW CONV (MISCELLANEOUS) ×2 IMPLANT
RETRACTOR WND ALEXIS 25 LRG (MISCELLANEOUS) IMPLANT
RTRCTR WOUND ALEXIS 18CM MED (MISCELLANEOUS)
RTRCTR WOUND ALEXIS 25CM LRG (MISCELLANEOUS)
SEPRAFILM MEMBRANE 5X6 (MISCELLANEOUS) IMPLANT
SET BASIN LINEN APH (SET/KITS/TRAYS/PACK) ×2 IMPLANT
SOL PREP PROV IODINE SCRUB 4OZ (MISCELLANEOUS) ×2 IMPLANT
SPONGE DRAIN TRACH 4X4 STRL 2S (GAUZE/BANDAGES/DRESSINGS) IMPLANT
SPONGE GAUZE 4X4 12PLY (GAUZE/BANDAGES/DRESSINGS) ×2 IMPLANT
SPONGE LAP 18X18 X RAY DECT (DISPOSABLE) IMPLANT
STAPLER VISISTAT 35W (STAPLE) IMPLANT
STRIP CLOSURE SKIN 1/2X4 (GAUZE/BANDAGES/DRESSINGS) ×4 IMPLANT
SUT CHROMIC 0 CT 1 (SUTURE) ×18 IMPLANT
SUT CHROMIC 2 0 CT 1 (SUTURE) ×4 IMPLANT
SUT CHROMIC GUT BROWN 0 54 (SUTURE) IMPLANT
SUT CHROMIC GUT BROWN 0 54IN (SUTURE)
SUT ETHILON 3 0 FSL (SUTURE) IMPLANT
SUT PDS AB CT VIOLET #0 27IN (SUTURE) IMPLANT
SUT PLAIN CT 1/2CIR 2-0 27IN (SUTURE) ×4 IMPLANT
SUT PROLENE 0 CT 1 30 (SUTURE) IMPLANT
SUT VIC AB 0 CT1 27 (SUTURE) ×1
SUT VIC AB 0 CT1 27XBRD ANTBC (SUTURE) ×1 IMPLANT
SUT VIC AB 2-0 CT1 27 (SUTURE)
SUT VIC AB 2-0 CT1 TAPERPNT 27 (SUTURE) IMPLANT
SUT VICRYL 4 0 KS 27 (SUTURE) ×2 IMPLANT
SYR CONTROL 10ML LL (SYRINGE) ×2 IMPLANT
TAPE CLOTH SURG 4X10 WHT LF (GAUZE/BANDAGES/DRESSINGS) ×2 IMPLANT
TOWEL BLUE STERILE X RAY DET (MISCELLANEOUS) ×2 IMPLANT
TOWEL OR 17X26 4PK STRL BLUE (TOWEL DISPOSABLE) ×2 IMPLANT
TRAY FOLEY CATH 14FR (SET/KITS/TRAYS/PACK) ×2 IMPLANT

## 2011-10-20 NOTE — Brief Op Note (Signed)
10/20/2011  2:19 PM  PATIENT:  Rita [name]  50 y.o. female  PRE-OPERATIVE DIAGNOSIS:  Failed Endometrial Ablation, Dysmenorrhea, Chronic Pelvic Pain  POST-OPERATIVE DIAGNOSIS:  Failed Endometrial Ablation, Dysmenorrhea, Chronic Pelvic Pain  PROCEDURE:  Procedure(s): HYSTERECTOMY ABDOMINAL SCAR REVISION  SURGEON:  Surgeon(s): Tilda Burrow, MD  PHYSICIAN ASSISTANT:   ASSISTANTS: Dallas Rnfa  ANESTHESIA:   local and general  EBL:  Total I/O In: 1000 [I.V.:1000] Out: 350 [Urine:250; Blood:100]  BLOOD ADMINISTERED:none  DRAINS: Urinary Catheter (Foley)   LOCAL MEDICATIONS USED:  MARCAINE 18CC  SPECIMEN:  Source of Specimen:  UTERUS AND CERVIX  DISPOSITION OF SPECIMEN:  PATHOLOGY  COUNTS:  YES  TOURNIQUET:  * No tourniquets in log *  DICTATION: .Dragon Dictation  PLAN OF CARE: Admit to inpatient   PATIENT DISPOSITION:  PACU - hemodynamically stable.   Delay start of Pharmacological VTE agent (>24hrs) due to surgical blood loss or risk of bleeding:  not applicable

## 2011-10-20 NOTE — Op Note (Signed)
08/21/2011  9:29 AM  PATIENT:    PRE-OPERATIVE DIAGNOSIS: Primary dysmenorrhea failed endometrial ablation , history of prior cesarean section  POST-OPERATIVE DIAGNOSIS:  Same  PROCEDURE:  Procedure(s): Abdominal hysterectomy, wide excision of old C-section scar  SURGEON:Jaceon Heiberger Benancio Deeds, MD  PHYSICIAN ASSISTANT: Dallas RN FA  ADDITIONAL ASSISTANTS: none   ANESTHESIA:   local and general  ESTIMATED BLOOD LOSS: 100 cc   BLOOD ADMINISTERED:none  DRAINS: Urinary Catheter (Foley)   LOCAL MEDICATIONS USED:  MARCAINE 18 CC  SPECIMEN:  Source of Specimen:  Uterus and cervix  DISPOSITION OF SPECIMEN:  PATHOLOGY  COUNTS:  YES  TOURNIQUET:  * No tourniquets in log *  DICTATION #: Patient was taken to the operating room prepped and draped in the standard fashion with the legs flexed over pillows prior to plate initiating anesthesia. Timeout was conducted, confirmed by mouth parties and procedure initiated with wide excision of the old cicatrix scar fascia was opened transversely in standard method of Pfannenstiel and peritoneal cavity entered carefully. Was packed away with moistened laparotomy tapes and uterus identified. Uterus is small mobile ovaries were inspected. They were normal. Evidence of prior tubal ligation prior cesarean section excessive. Round ligaments were taken down using 0 chromic suture ligature and cautery transection and bladder flap developed anteriorly. Utero-ovarian ligaments were clamped cut and suture ligated bilaterally using 0 chromic suture upper and lower cardinal ligaments were clamped with Heaney clamps transected and suture ligated. Lower cardinal ligaments clamped cut and suture ligated similarly with #15 blade into the anterior cervicovaginal fornix, and then the cervix amputated off the cuff and cuff was closed with Aldridge stitches at each lateral vaginal angle, and then interrupted 0 chromic sutures used to close the remaining cuff.  Pelvis was  irrigated hemostasis confirmed, and bladder flap loosely reapproximated over the cuff. Primate, was removed and peritoneum closed anteriorly with 2-0 chromic, the fascia closed with 0 Vicryl, the subcutaneous space reapproximated with 2-0 plain, then subcuticular 4-0 Vicryl closure of skin with Mellody Dance needle performed sponge and needle counts correct made blood loss 100 cc patient to recovery room in good condition PLAN OF CARE: Inpatient care  PATIENT DISPOSITION:  PACU - hemodynamically stable.   Delay start of Pharmacological VTE agent (>24hrs) due to surgical blood loss or risk of bleeding:  not applicable

## 2011-10-20 NOTE — Anesthesia Postprocedure Evaluation (Signed)
  Anesthesia Post-op Note  Patient: Rita [name]  Procedure(s) Performed:  HYSTERECTOMY ABDOMINAL; SCAR REVISION - Wide excision of Prior Cesarean Section Scar--Scar tissue discarded per Dr. Emelda Fear order  Patient Location: PACU  Anesthesia Type: General  Level of Consciousness: awake and alert   Airway and Oxygen Therapy: Patient Spontanous Breathing and non-rebreather face mask  Post-op Pain: moderate  Post-op Assessment: Post-op Vital signs reviewed  Post-op Vital Signs: Reviewed and stable  Complications: No apparent anesthesia complications

## 2011-10-20 NOTE — Progress Notes (Signed)
Day of Surgery Procedure(s): HYSTERECTOMY ABDOMINAL SCAR REVISION  Subjective: Patient reports inadequate pain relief from PCA. Pt has received IV toradol at 6 pm but has maxed out on Dilaudid pca.      Objective: I have reviewed patient's vital signs and medications. BP 148/79  Pulse 67  Temp(Src) 98.5 F (36.9 C) (Oral)  Resp 18  SpO2 94%  LMP 10/16/2011 abd soft, dressing dry  Assessment: s/p Procedure(s): HYSTERECTOMY ABDOMINAL SCAR REVISION: inadequate pain relief, will restart Oxycontin 15 mg q 6 h  Plan: increase pain meds  LOS: 0 days    Phineas Mcenroe V 10/20/2011, 6:59 PM

## 2011-10-20 NOTE — Anesthesia Preprocedure Evaluation (Addendum)
Anesthesia Evaluation  Patient identified by MRN, date of birth, ID band Patient awake  General Assessment Comment  Reviewed: Allergy & Precautions, H&P , NPO status , Patient's Chart, lab work & pertinent test results  History of Anesthesia Complications Negative for: history of anesthetic complications  Airway Mallampati: I      Dental  (+) Edentulous Upper and Edentulous Lower   Pulmonary Current Smoker    Pulmonary exam normal       Cardiovascular Regular Normal    Neuro/Psych  Headaches, PSYCHIATRIC DISORDERS Depression    GI/Hepatic   Endo/Other    Renal/GU      Musculoskeletal   Abdominal   Peds  Hematology   Anesthesia Other Findings   Reproductive/Obstetrics                           Anesthesia Physical Anesthesia Plan  ASA: III  Anesthesia Plan: General   Post-op Pain Management:    Induction: Intravenous  Airway Management Planned: Oral ETT  Additional Equipment:   Intra-op Plan:   Post-operative Plan: Extubation in OR  Informed Consent: I have reviewed the patients History and Physical, chart, labs and discussed the procedure including the risks, benefits and alternatives for the proposed anesthesia with the patient or authorized representative who has indicated his/her understanding and acceptance.     Plan Discussed with:   Anesthesia Plan Comments:         Anesthesia Quick Evaluation

## 2011-10-20 NOTE — Anesthesia Procedure Notes (Signed)
Procedure Name: Intubation Date/Time: 10/20/2011 12:39 PM Performed by: Minerva Areola Pre-anesthesia Checklist: Patient identified, Patient being monitored, Timeout performed, Emergency Drugs available and Suction available Patient Re-evaluated:Patient Re-evaluated prior to inductionOxygen Delivery Method: Circle System Utilized Preoxygenation: Pre-oxygenation with 100% oxygen Intubation Type: Rapid sequence and Circoid Pressure applied Laryngoscope Size: Miller and 2 Grade View: Grade I Tube type: Oral Tube size: 7.0 mm Number of attempts: 1 Airway Equipment and Method: stylet Placement Confirmation: ETT inserted through vocal cords under direct vision,  positive ETCO2 and breath sounds checked- equal and bilateral Secured at: 21 cm Tube secured with: Tape Dental Injury: Teeth and Oropharynx as per pre-operative assessment

## 2011-10-20 NOTE — H&P (Signed)
Rita [name] is an 50 y.o. female. With chronic debilitating back pain as well as incapacitating dysmenorrhea which has not responded to attempted endometrial ablation earlier this year. Menses are can continuing to be accompanied by debilitating pain keeping her in the bed. She is fully aware that the chronic back pain will remain unaffected by the surgical procedure the blood that the increasing severity of discomfort in the lower pelvis and low back associated with her menses should improve absolute guarantees of improvement cannot be given to the patient and she understands that risks of procedure including bleeding, infection or surgical complications requiring operation have been discussed as a prior preoperative counseling.  Pertinent Gynecological History: Menses: flow is moderate and with severe dysmenorrhea Bleeding: Only slightly improved after recent endometrial ablation. Contraception: tubal ligation and Endometrial ablation DES exposure: denies Blood transfusions: none Sexually transmitted diseases: no past history Previous GYN Procedures: Endometrial ablation tubal ligation  Last mammogram: normal Date:  Last pap: normal Date:  OB History: G, P   Menstrual History: Menarche age:  Patient's last menstrual period was 09/16/2011.    Past Medical History  Diagnosis Date  . Chronic low back pain   . Depression   . Migraine headache     Past Surgical History  Procedure Date  . Cervical spine surgery   . Rotator cuff repair     left  . Back surgery   . Colonoscopy   . Endometrial ablation june 2012    Family History  Problem Relation Age of Onset  . Anesthesia problems Neg Hx   . Hypotension Neg Hx   . Malignant hyperthermia Neg Hx   . Pseudochol deficiency Neg Hx     Social History:  reports that she has been smoking Cigarettes.  She has a 5 pack-year smoking history. She does not have any smokeless tobacco history on file. She reports that she does not drink  alcohol or use illicit drugs.  Allergies: No Known Allergies  No prescriptions prior to admission    ROS chronic back pain followed and managed her pain clinic  Last menstrual period 09/16/2011. Physical ExamPhysical Examination: General appearance - alert, well appearing, and ianxious, ill-appearing and walking with mild steps. Mental status - alert, oriented to person, place, and time, anxious Neck - supple, no significant adenopathy Chest - clear to auscultation, no wheezes, rales or rhonchi, symmetric air entry, no tachypnea, retractions or cyanosis Heart - normal rate and regular rhythm, no gallops noted Abdomen - soft, nontender, nondistended, no masses or organomegaly Pelvic - normal external genitalia, vulva, vagina, cervix, uterus and adnexa, VULVA: normal appearing vulva with no masses, tenderness or lesions, VAGINA: normal appearing vagina with normal color and discharge, no lesions, PELVIC FLOOR EXAM: no cystocele, rectocele or prolapse noted, CERVIX: normal appearing cervix without discharge or lesions, UTERUS: tenderness with examination, anteverted, mobile, ADNEXA: normal adnexa in size, nontender and no masses, no masses Extremities - peripheral pulses normal, no pedal edema, no clubbing or cyanosis, strength 4/5 in the left lower extremity upon testing plantar flexion dorsiflexion similarly weakened, considered chronic and stable  No results found for this or any previous visit (from the past 24 hour(s)).  No results found.  Assessment/Plan: Recurrent dysmenorrhea suspected adenomyosis Status post failed endometrial ablation chronic low pain tolerance secondary to chronic back and neck problems and surgery  Plan abdominal hysterectomy  Pfannenstiel incision risks rationale and potential outcomes reviewed with the patient  Orla Jolliff V 10/20/2011, 8:14 AM

## 2011-10-20 NOTE — Transfer of Care (Signed)
Immediate Anesthesia Transfer of Care Note  Patient: Rita [name]  Procedure(s) Performed:  HYSTERECTOMY ABDOMINAL; SCAR REVISION - Wide excision of Prior Cesarean Section Scar--Scar tissue discarded per Dr. Emelda Fear order  Patient Location: PACU  Anesthesia Type: General  Level of Consciousness: awake  Airway & Oxygen Therapy: Patient Spontanous Breathing and non-rebreather face mask  Post-op Assessment: Report given to PACU RN, Post -op Vital signs reviewed and stable and Patient moving all extremities  Post vital signs: Reviewed and stable  Complications: No apparent anesthesia complications

## 2011-10-21 LAB — CBC
Hemoglobin: 11.4 g/dL — ABNORMAL LOW (ref 12.0–15.0)
MCH: 29.5 pg (ref 26.0–34.0)
Platelets: 134 10*3/uL — ABNORMAL LOW (ref 150–400)
RBC: 3.86 MIL/uL — ABNORMAL LOW (ref 3.87–5.11)

## 2011-10-21 LAB — BASIC METABOLIC PANEL
CO2: 24 mEq/L (ref 19–32)
Calcium: 8.3 mg/dL — ABNORMAL LOW (ref 8.4–10.5)
GFR calc non Af Amer: >90 mL/min (ref >90)
Potassium: 4.2 mEq/L (ref 3.5–5.1)
Sodium: 135 mEq/L (ref 135–145)

## 2011-10-21 MED ORDER — HYDROMORPHONE 0.3 MG/ML IV SOLN
INTRAVENOUS | Status: AC
  Filled 2011-10-21: qty 25

## 2011-10-21 MED ORDER — NICOTINE 21 MG/24HR TD PT24
21.0000 mg | MEDICATED_PATCH | Freq: Every day | TRANSDERMAL | Status: DC
  Administered 2011-10-21 – 2011-10-22 (×2): 21 mg via TRANSDERMAL
  Filled 2011-10-21 (×2): qty 1

## 2011-10-21 MED ORDER — HYDROMORPHONE 0.3 MG/ML IV SOLN
INTRAVENOUS | Status: AC
  Administered 2011-10-21: 19:00:00
  Filled 2011-10-21: qty 25

## 2011-10-21 MED ORDER — NICOTINE 21 MG/24HR TD PT24: 21.0000 mg | MEDICATED_PATCH | Freq: Every day | TRANSDERMAL | Status: DC

## 2011-10-21 NOTE — Progress Notes (Signed)
1 Day Post-Op Procedure(s): HYSTERECTOMY ABDOMINAL SCAR REVISION  Subjective: Patient reports incisional pain down to 8 at ends, 5 at middle, much improved, this is baseline for her, with the back pain.    Objective: I have reviewed patient's vital signs, medications and labs.   Resp: clear to auscultation bilaterally GI: soft, non-tender; bowel sounds normal; no masses,  no organomegaly and incision: light blood from skin edges , no hematoma.dressing changed   Assessment: s/p Procedure(s): HYSTERECTOMY ABDOMINAL SCAR REVISION: stable and progressing well  Plan: Advance diet, d/c foley, ambulate. Probable d/c in am.  LOS: 1 day    Vermon Grays V 10/21/2011, 9:01 AM

## 2011-10-21 NOTE — Addendum Note (Signed)
Addendum  created 10/21/11 0810 by Minerva Areola, CRNA   Modules edited:Notes Section

## 2011-10-21 NOTE — Anesthesia Postprocedure Evaluation (Signed)
Anesthesia Post Note  Patient: Rita [name]  Procedure(s) Performed:  HYSTERECTOMY ABDOMINAL; SCAR REVISION - Wide excision of Prior Cesarean Section Scar--Scar tissue discarded per Dr. Emelda Fear order  Anesthesia type: General  Patient location: 334  Post pain: Pain level controlled  Post assessment: Post-op Vital signs reviewed, Patient's Cardiovascular Status Stable, Respiratory Function Stable, Patent Airway, No signs of Nausea or vomiting and Pain level controlled 5/10  Last Vitals:  Filed Vitals:   10/21/11 0658  BP:   Pulse: 85  Temp: 36.7 C  Resp: 20    Post vital signs: Reviewed and stable  Level of consciousness: awake and alert   Complications: No apparent anesthesia complications

## 2011-10-22 MED ORDER — HYDROMORPHONE 0.3 MG/ML IV SOLN
INTRAVENOUS | Status: AC
  Administered 2011-10-22: 10:00:00
  Filled 2011-10-22: qty 25

## 2011-10-22 MED ORDER — KETOROLAC TROMETHAMINE 30 MG/ML IJ SOLN
30.0000 mg | Freq: Four times a day (QID) | INTRAMUSCULAR | Status: DC
  Administered 2011-10-23: 30 mg via INTRAMUSCULAR

## 2011-10-22 MED ORDER — OXYCODONE HCL 15 MG PO TB12
15.0000 mg | ORAL_TABLET | Freq: Three times a day (TID) | ORAL | Status: DC | PRN
  Administered 2011-10-23: 15 mg via ORAL
  Filled 2011-10-22: qty 1

## 2011-10-22 MED ORDER — OXYCODONE HCL 15 MG PO TB12: 15.0000 mg | ORAL_TABLET | Freq: Three times a day (TID) | ORAL | Status: DC

## 2011-10-22 MED ORDER — HYDROMORPHONE HCL 1 MG/ML IJ SOLN: 1.0000 mg | INTRAMUSCULAR | Status: DC | PRN

## 2011-10-22 MED ORDER — KETOROLAC TROMETHAMINE 30 MG/ML IJ SOLN
30.0000 mg | Freq: Four times a day (QID) | INTRAMUSCULAR | Status: DC
  Administered 2011-10-23: 30 mg via INTRAVENOUS
  Filled 2011-10-22 (×2): qty 1

## 2011-10-22 NOTE — Progress Notes (Signed)
2 Days Post-Op Procedure(s): HYSTERECTOMY ABDOMINAL SCAR REVISION  Subjective: Patient reports incisional pain and + flatus.  Pain still significant, and patient has been maximally using her PCA as well as her oxycontin 15 every 6 hrs. (less than her usual home doses of q4h) Nonetheless pt is quite overmedicated, and PCA is to be d/c'd.  Nursing has been assisting pt to bathroom.   Objective: I have reviewed patient's vital signs and medications.  General: slowed mentation and sedation, Sa02 100% on 2 l/min. Abd soft, moderately distended , hypoactive BS,  Incision small amt bruising, no fresh bleeding since yesterday. Legs nontender  Assessment: s/p Procedure(s): HYSTERECTOMY ABDOMINAL SCAR REVISION: Overmedicated.  Plan: eliminate PCA, reduce Oxycontin to Tid, and make order PRN, not automatic, and replace analgesic effect with IV Toradol   LOS: 2 days    Shoua Ulloa V 10/22/2011, 3:20 PM

## 2011-10-23 DIAGNOSIS — N946 Dysmenorrhea, unspecified: Principal | ICD-10-CM

## 2011-10-23 LAB — CBC
Hemoglobin: 11.5 g/dL — ABNORMAL LOW (ref 12.0–15.0)
MCV: 89.1 fL (ref 78.0–100.0)
Platelets: 140 10*3/uL — ABNORMAL LOW (ref 150–400)
RBC: 3.86 MIL/uL — ABNORMAL LOW (ref 3.87–5.11)
WBC: 9.5 10*3/uL (ref 4.0–10.5)

## 2011-10-23 NOTE — Progress Notes (Signed)
Patient stable for discharge. Dressing removed, incision clean, Steristrips replaced. VS reviewed D/c paperwork completed  F/U 2 wks [facility] See d/c summary for details

## 2011-10-23 NOTE — Progress Notes (Signed)
UR chart review completed.  

## 2011-10-26 ENCOUNTER — Encounter (HOSPITAL_COMMUNITY): Payer: Self-pay | Admitting: Obstetrics and Gynecology

## 2011-10-29 NOTE — Progress Notes (Deleted)
Subjective: Patient reports + BM and no problems voiding.    Objective: I have reviewed patient's vital signs, intake and output, medications and pathology.  General: alert and Anxious regarding Rita [name] and concern over a low-grade fever that persists and GI: soft, non-tender; bowel sounds normal; no masses,  no organomegaly and incision: clean, dry and intact Legs without tenderness   Assessment/Plan: Postop day 3 status post laparotomy with right salpingo-oophorectomy for right tubo-ovarian abscess  2 postop anemia  3 slow patient recovery  Plan convert to oral antibiotics overnight observation to ensure fever completely resolved to discharge in a.m. staple removal prior to discharge  LOS: 3 days     Rita [name] V 10/29/2011, 12:19 PM

## 2011-10-29 NOTE — Discharge Summary (Signed)
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  Hospital summary this 50 year old female with chronic debilitating pain and dysmenorrhea was admitted 6 months after failed endometrial ablation on 10/20/2011. Postop care notable for stable surgical care but overmedication with analgesics. Patient had discontinuation of her PCA postop day 2 improved dramatically and was stable for discharge postop day 3 with routine postsurgical instructions reviewed with the patient for followup 4 days in the office [facility]. Patient instructed to return earlier when necessary bleeding pain management issues are fever chills or usual surgical complications  Followup [facility] 4 days

## 2012-07-24 ENCOUNTER — Emergency Department (HOSPITAL_COMMUNITY): Payer: Medicare Other

## 2012-07-24 ENCOUNTER — Emergency Department (HOSPITAL_COMMUNITY)
Admission: EM | Admit: 2012-07-24 | Discharge: 2012-07-24 | Disposition: A | Discharge status: Home / Self Care | Payer: Medicare Other | Attending: Emergency Medicine | Admitting: Emergency Medicine

## 2012-07-24 ENCOUNTER — Encounter (HOSPITAL_COMMUNITY): Payer: Self-pay | Admitting: Emergency Medicine

## 2012-07-24 DIAGNOSIS — F17200 Nicotine dependence, unspecified, uncomplicated: Secondary | ICD-10-CM | POA: Insufficient documentation

## 2012-07-24 DIAGNOSIS — R51 Headache: Secondary | ICD-10-CM | POA: Insufficient documentation

## 2012-07-24 DIAGNOSIS — Z79899 Other long term (current) drug therapy: Secondary | ICD-10-CM | POA: Insufficient documentation

## 2012-07-24 DIAGNOSIS — R55 Syncope and collapse: Secondary | ICD-10-CM | POA: Insufficient documentation

## 2012-07-24 DIAGNOSIS — F329 Major depressive disorder, single episode, unspecified: Secondary | ICD-10-CM | POA: Insufficient documentation

## 2012-07-24 DIAGNOSIS — F3289 Other specified depressive episodes: Secondary | ICD-10-CM | POA: Insufficient documentation

## 2012-07-24 DIAGNOSIS — R079 Chest pain, unspecified: Secondary | ICD-10-CM | POA: Insufficient documentation

## 2012-07-24 DIAGNOSIS — M549 Dorsalgia, unspecified: Secondary | ICD-10-CM | POA: Insufficient documentation

## 2012-07-24 DIAGNOSIS — G8929 Other chronic pain: Secondary | ICD-10-CM

## 2012-07-24 DIAGNOSIS — M542 Cervicalgia: Secondary | ICD-10-CM | POA: Insufficient documentation

## 2012-07-24 MED ORDER — ONDANSETRON HCL 4 MG/2ML IJ SOLN
4.0000 mg | Freq: Once | INTRAMUSCULAR | Status: AC
  Administered 2012-07-24: 4 mg via INTRAVENOUS
  Filled 2012-07-24: qty 2

## 2012-07-24 MED ORDER — DIPHENHYDRAMINE HCL 50 MG/ML IJ SOLN
12.5000 mg | Freq: Once | INTRAMUSCULAR | Status: AC
  Administered 2012-07-24: 12.5 mg via INTRAVENOUS
  Filled 2012-07-24: qty 1

## 2012-07-24 MED ORDER — KETOROLAC TROMETHAMINE 30 MG/ML IJ SOLN
30.0000 mg | Freq: Once | INTRAMUSCULAR | Status: AC
  Administered 2012-07-24: 30 mg via INTRAVENOUS
  Filled 2012-07-24: qty 1

## 2012-07-24 MED ORDER — HYDROMORPHONE HCL PF 1 MG/ML IJ SOLN
1.0000 mg | Freq: Once | INTRAMUSCULAR | Status: AC
  Administered 2012-07-24: 1 mg via INTRAVENOUS
  Filled 2012-07-24: qty 1

## 2012-07-24 NOTE — ED Provider Notes (Signed)
History     CSN: [id number]  Arrival date & time 07/24/12  7829   First MD Initiated Contact with Patient 07/24/12 [phone]      Chief Complaint  Patient presents with  . Back Pain  . Neck Pain  . Chest Pain    (Consider location/radiation/quality/duration/timing/severity/associated sxs/prior treatment) HPI Comments: Patient c/o acute on chronic lower back pain, neck pain and chest pain.  States she has a hx of similar symptoms and is followed by a pain management physician in [place].  States her symptoms became worse while in church.  She also states that her pain medications are not as effective as previous in controlling her level of pain.  Also c/o frontal headache PTA that was gradual in onset.  She denies shortness of breath, numbness, weakness, recent illness, neck stiffness, fever or vomiting.  States pain today is similar to previous  Patient is a 51 y.o. female presenting with back pain. The history is provided by the patient.  Back Pain  This is a chronic problem. Episode onset: several hours PTA. The problem occurs constantly. The problem has not changed since onset.The pain is associated with no known injury. The pain is present in the lumbar spine. The quality of the pain is described as aching. The pain does not radiate. The pain is severe. The symptoms are aggravated by bending, twisting and certain positions. The pain is the same all the time. Associated symptoms include chest pain, headaches and leg pain. Pertinent negatives include no fever, no numbness, no abdominal pain, no abdominal swelling, no bowel incontinence, no perianal numbness, no dysuria, no pelvic pain, no paresthesias, no paresis, no tingling and no weakness. Associated symptoms comments: neck pain. She has tried analgesics for the symptoms. The treatment provided no relief.    Past Medical History  Diagnosis Date  . Chronic low back pain   . Depression   . Migraine headache     Past Surgical History    Procedure Date  . Cervical spine surgery   . Rotator cuff repair     left  . Back surgery   . Colonoscopy   . Endometrial ablation june 2012  . Abdominal hysterectomy 10/20/2011    Procedure: HYSTERECTOMY ABDOMINAL;  Surgeon: Tilda Burrow, MD;  Location: [facility];  Service: Gynecology;  Laterality: N/A;  . Scar revision 10/20/2011    Procedure: SCAR REVISION;  Surgeon: Tilda Burrow, MD;  Location: [facility];  Service: Gynecology;  Laterality: N/A;  Wide excision of Prior Cesarean Section Scar--Scar tissue discarded per Dr. Emelda Fear order    Family History  Problem Relation Age of Onset  . Anesthesia problems Neg Hx   . Hypotension Neg Hx   . Malignant hyperthermia Neg Hx   . Pseudochol deficiency Neg Hx     History  Substance Use Topics  . Smoking status: Current Everyday Smoker -- 0.2 packs/day for 20 years    Types: Cigarettes  . Smokeless tobacco: Not on file  . Alcohol Use: No    OB History    Grav Para Term Preterm Abortions TAB SAB Ect Mult Living   1 1 1       1       Review of Systems  Constitutional: Negative for fever.  Eyes: Negative for photophobia and visual disturbance.  Respiratory: Negative for shortness of breath.   Cardiovascular: Positive for chest pain.  Gastrointestinal: Negative for vomiting, abdominal pain, constipation and bowel incontinence.  Genitourinary: Negative for dysuria, hematuria, flank pain,  decreased urine volume, difficulty urinating and pelvic pain.       Perineal numbness or incontinence of urine or feces  Musculoskeletal: Positive for back pain and arthralgias. Negative for joint swelling.  Skin: Negative for rash.  Neurological: Positive for headaches. Negative for dizziness, tingling, facial asymmetry, weakness, numbness and paresthesias.  Psychiatric/Behavioral: Negative for confusion and decreased concentration.  All other systems reviewed and are negative.    Allergies  Review of patient's allergies indicates no known  allergies.  Home Medications   Current Outpatient Rx  Name Route Sig Dispense Refill  . CALCIUM CARBONATE-VITAMIN D 500-200 MG-UNIT PO TABS Oral Take 2 tablets by mouth daily.    Marland Kitchen DIAZEPAM 5 MG PO TABS Oral Take 5 mg by mouth every 6 (six) hours.     Marland Kitchen METHOCARBAMOL 500 MG PO TABS Oral Take 500 mg by mouth every 8 (eight) hours as needed. pain    . ONDANSETRON HCL 8 MG PO TABS Oral Take 8 mg by mouth every 8 (eight) hours as needed. nausea    . OXYCODONE HCL ER 15 MG PO TB12 Oral Take 15 mg by mouth every 4 (four) hours.      Marland Kitchen POLYETHYLENE GLYCOL 3350 PO PACK Oral Take 17 g by mouth daily.    . TOPIRAMATE 200 MG PO TABS Oral Take 200 mg by mouth daily.    Marland Kitchen UNABLE TO FIND Oral Take 1 tablet by mouth daily as needed. Med Name:OTC Diuretic for swelling if needed    . VITAMIN C 500 MG PO TABS Oral Take 500 mg by mouth daily.    . DULOXETINE HCL 60 MG PO CPEP Oral Take 60 mg by mouth at bedtime.       BP 144/80  Pulse 81  Temp 98.1 F (36.7 C) (Oral)  Resp 20  Ht 5\' 2"  (1.575 m)  Wt 170 lb (77.111 kg)  BMI 31.09 kg/m2  SpO2 97%  LMP 10/16/2011  Physical Exam  Nursing note and vitals reviewed. Constitutional: She is oriented to person, place, and time. She appears well-developed and well-nourished.       Tearful and appears anxious  HENT:  Head: Normocephalic and atraumatic.  Mouth/Throat: Oropharynx is clear and moist.  Eyes: EOM are normal. Pupils are equal, round, and reactive to light.  Neck: Phonation normal. Neck supple. No JVD present. Spinous process tenderness and muscular tenderness present. No rigidity. Decreased range of motion present. No edema and no erythema present. No Brudzinski's sign and no Kernig's sign noted. No thyromegaly present.  Cardiovascular: Normal rate, regular rhythm, normal heart sounds and intact distal pulses.   No murmur heard. Pulmonary/Chest: Effort normal and breath sounds normal.  Musculoskeletal: She exhibits tenderness. She exhibits no  edema.       Cervical back: She exhibits tenderness and bony tenderness. She exhibits normal range of motion, no swelling, no edema, no deformity, no laceration, no spasm and normal pulse.       Lumbar back: She exhibits tenderness and pain. She exhibits normal range of motion, no swelling, no deformity, no laceration and normal pulse.       Back:  Neurological: She is alert and oriented to person, place, and time. No cranial nerve deficit or sensory deficit. She exhibits normal muscle tone. Coordination and gait normal.  Reflex Scores:      Tricep reflexes are 2+ on the right side and 2+ on the left side.      Bicep reflexes are 2+ on the right  side and 2+ on the left side.      Brachioradialis reflexes are 2+ on the right side and 2+ on the left side.      Patellar reflexes are 2+ on the right side and 2+ on the left side.      Achilles reflexes are 2+ on the right side and 2+ on the left side. Skin: Skin is warm and dry.    ED Course  Procedures (including critical care time)  Labs Reviewed - No data to display Dg Cervical Spine Complete  07/24/2012  *RADIOLOGY REPORT*  Clinical Data: Back pain, neck pain, syncope  CERVICAL SPINE - COMPLETE 4+ VIEW  Comparison: 09/02/2009 and CT scan 01/09/2011   Findings: 6 views of the cervical spine submitted.  No acute fracture or subluxation. Stable mild degenerative changes C1-C2 articulation.  Anterior metallic fixation plate Z6-X0 level again noted.  No prevertebral soft tissue swelling.  Stable mild disc space flattening at C3-C4 and C4-C5 level.  Stable anterior spurring lower endplate of C3 and C4 vertebral body.  Cervical airway is patent.  IMPRESSION: No acute fracture or subluxation.  Mild degenerative changes as described above.  Stable postsurgical changes at C5-C6 level.  Original Report Authenticated By: Natasha Mead, M.D.   Dg Lumbar Spine Complete  07/24/2012  *RADIOLOGY REPORT*  Clinical Data: Back pain, syncope  LUMBAR SPINE - COMPLETE 4+  VIEW  Comparison: Sagittal images of lumbar spine 07/02/2011  Findings: Five views of the lumbar spine submitted.  No acute fracture or subluxation.  Postsurgical changes again noted with prior anterior fixation material at the L4-L5 level.  Again noted multilevel endplate anterior spurring.  There is mild disc space flattening with anterior spurring at T12-L1 level.  Minimal disc space flattening at L5-S1 level.  IMPRESSION: No acute fracture or subluxation.  Mild degenerative changes as described above.  Postsurgical changes at L4-L5 level.  Original Report Authenticated By: Natasha Mead, M.D.   Dg Pelvis 1-2 Views  07/24/2012  *RADIOLOGY REPORT*  Clinical Data: Back pain, syncopal episode  PELVIS - 1-2 VIEW  Comparison: CT scan 07/02/2011  Findings: Single frontal view of the pelvis submitted.  No acute fracture or subluxation.  Postsurgical changes noted lumbar spine at L4-L5 level.  IMPRESSION: No acute fracture or subluxation.  Original Report Authenticated By: Natasha Mead, M.D.   Ct Head Wo Contrast  07/24/2012  *RADIOLOGY REPORT*  Clinical Data: Syncope  CT HEAD WITHOUT CONTRAST  Technique:  Contiguous axial images were obtained from the base of the skull through the vertex without contrast.  Comparison: None  Findings: No skull fracture is noted. The mastoid air cells are unremarkable. Mucosal thickening ethmoid air cells.  The sphenoid sinuses and maxillary sinuses are unremarkable.  No intracranial hemorrhage, mass effect or midline shift.  No acute infarction.  No mass lesion is noted on this unenhanced scan.  No intra or extra-axial fluid collection.  IMPRESSION: No acute intracranial abnormality.  Original Report Authenticated By: Natasha Mead, M.D.        MDM    Date: 07/24/2012  Rate: 72  Rhythm: normal sinus rhythm  QRS Axis: normal  Intervals: normal  ST/T Wave abnormalities: normal  Conduction Disutrbances:none  Narrative Interpretation:   Old EKG Reviewed: no significant changes    ekg reviewed by Dr. Adriana Simas     Patient is resting. Vital signs are stable. She is nontoxic appearing. No meningeal signs or focal neuro deficits on her exam. Patient reports history of chronic pain and is  followed by a pain management Center in [place]. She states she has an appointment with her primary care physician and her pain management physician this week.  Doubt acute neurological or cardiac process  Imaging today is negative. Patient was also seen by the EDP and care plan was discussed. Patient is in agreement to see her pain management physician this week for follow-up   The patient appears reasonably screened and/or stabilized for discharge and I doubt any other medical condition or other [facility] requiring further screening, evaluation, or treatment in the ED at this time prior to discharge.    Harlie Buening L. [place], [place] 07/29/12 1725

## 2012-07-24 NOTE — ED Notes (Signed)
Pt c/o back/neck pain that radiates to the chest. Pt c/o pain as chronic, but became worse during church.

## 2012-07-24 NOTE — ED Notes (Signed)
Patient transported to X-ray 

## 2012-07-24 NOTE — ED Provider Notes (Signed)
History     CSN: [id number]  Arrival date & time 07/24/12  3244   First MD Initiated Contact with Patient 07/24/12 [phone]      Chief Complaint  Patient presents with  . Back Pain  . Neck Pain  . Chest Pain    (Consider location/radiation/quality/duration/timing/severity/associated sxs/prior treatment) HPI.....Marland Kitchen chronic  Past Medical History  Diagnosis Date  . Chronic low back pain   . Depression   . Migraine headache     Past Surgical History  Procedure Date  . Cervical spine surgery   . Rotator cuff repair     left  . Back surgery   . Colonoscopy   . Endometrial ablation june 2012  . Abdominal hysterectomy 10/20/2011    Procedure: HYSTERECTOMY ABDOMINAL;  Surgeon: Tilda Burrow, MD;  Location: [facility];  Service: Gynecology;  Laterality: N/A;  . Scar revision 10/20/2011    Procedure: SCAR REVISION;  Surgeon: Tilda Burrow, MD;  Location: [facility];  Service: Gynecology;  Laterality: N/A;  Wide excision of Prior Cesarean Section Scar--Scar tissue discarded per Dr. Emelda Fear order    Family History  Problem Relation Age of Onset  . Anesthesia problems Neg Hx   . Hypotension Neg Hx   . Malignant hyperthermia Neg Hx   . Pseudochol deficiency Neg Hx     History  Substance Use Topics  . Smoking status: Current Everyday Smoker -- 0.2 packs/day for 20 years    Types: Cigarettes  . Smokeless tobacco: Not on file  . Alcohol Use: No    OB History    Grav Para Term Preterm Abortions TAB SAB Ect Mult Living   1 1 1       1       Review of Systems  Allergies  Review of patient's allergies indicates no known allergies.  Home Medications   Current Outpatient Rx  Name Route Sig Dispense Refill  . DIAZEPAM 5 MG PO TABS Oral Take 5 mg by mouth every 6 (six) hours.     . DULOXETINE HCL 60 MG PO CPEP Oral Take 60 mg by mouth daily.      . OXYCODONE HCL ER 15 MG PO TB12 Oral Take 15 mg by mouth every 4 (four) hours.        BP 144/80  Pulse 81  Temp 98.1 F (36.7  C) (Oral)  Resp 20  Ht 5\' 2"  (1.575 m)  Wt 170 lb (77.111 kg)  BMI 31.09 kg/m2  SpO2 97%  LMP 10/16/2011  Physical Exam  ED Course  Procedures (including critical care time)  Labs Reviewed - No data to display Dg Cervical Spine Complete  07/24/2012  *RADIOLOGY REPORT*  Clinical Data: Back pain, neck pain, syncope  CERVICAL SPINE - COMPLETE 4+ VIEW  Comparison: 09/02/2009 and CT scan 01/09/2011   Findings: 6 views of the cervical spine submitted.  No acute fracture or subluxation. Stable mild degenerative changes C1-C2 articulation.  Anterior metallic fixation plate V2-Z3 level again noted.  No prevertebral soft tissue swelling.  Stable mild disc space flattening at C3-C4 and C4-C5 level.  Stable anterior spurring lower endplate of C3 and C4 vertebral body.  Cervical airway is patent.  IMPRESSION: No acute fracture or subluxation.  Mild degenerative changes as described above.  Stable postsurgical changes at C5-C6 level.  Original Report Authenticated By: Natasha Mead, M.D.   Dg Lumbar Spine Complete  07/24/2012  *RADIOLOGY REPORT*  Clinical Data: Back pain, syncope  LUMBAR SPINE - COMPLETE 4+ VIEW  Comparison: Sagittal images of lumbar spine 07/02/2011  Findings: Five views of the lumbar spine submitted.  No acute fracture or subluxation.  Postsurgical changes again noted with prior anterior fixation material at the L4-L5 level.  Again noted multilevel endplate anterior spurring.  There is mild disc space flattening with anterior spurring at T12-L1 level.  Minimal disc space flattening at L5-S1 level.  IMPRESSION: No acute fracture or subluxation.  Mild degenerative changes as described above.  Postsurgical changes at L4-L5 level.  Original Report Authenticated By: Natasha Mead, M.D.   Dg Pelvis 1-2 Views  07/24/2012  *RADIOLOGY REPORT*  Clinical Data: Back pain, syncopal episode  PELVIS - 1-2 VIEW  Comparison: CT scan 07/02/2011  Findings: Single frontal view of the pelvis submitted.  No acute  fracture or subluxation.  Postsurgical changes noted lumbar spine at L4-L5 level.  IMPRESSION: No acute fracture or subluxation.  Original Report Authenticated By: Natasha Mead, M.D.   Ct Head Wo Contrast  07/24/2012  *RADIOLOGY REPORT*  Clinical Data: Syncope  CT HEAD WITHOUT CONTRAST  Technique:  Contiguous axial images were obtained from the base of the skull through the vertex without contrast.  Comparison: None  Findings: No skull fracture is noted. The mastoid air cells are unremarkable. Mucosal thickening ethmoid air cells.  The sphenoid sinuses and maxillary sinuses are unremarkable.  No intracranial hemorrhage, mass effect or midline shift.  No acute infarction.  No mass lesion is noted on this unenhanced scan.  No intra or extra-axial fluid collection.  IMPRESSION: No acute intracranial abnormality.  Original Report Authenticated By: Natasha Mead, M.D.     No diagnosis found.   Date: 07/24/2012  Rate: 72  Rhythm: normal sinus rhythm  QRS Axis: normal  Intervals: normal  ST/T Wave abnormalities: normal  Conduction Disutrbances: none  Narrative Interpretation: unremarkable     MDM  Medical screening examination/treatment/procedure(s) were conducted as a shared visit with non-physician practitioner(s) and myself.  I personally evaluated the patient during the encounter.......... patient is hemodynamically stable.  Pain is chronic in nature. Has pain management physician...  screening tests negative.      Donnetta Hutching, MD 08/03/12 [phone]

## 2012-08-03 NOTE — ED Provider Notes (Signed)
Medical screening examination/treatment/procedure(s) were conducted as a shared visit with non-physician practitioner(s) and myself.  I personally evaluated the patient during the encounter.  No meningeal signs, no neuro deficits, no bowel or bladder incontinence. Patient feeling better after pain management and IV fluids.  His primary care followup  Donnetta Hutching, MD 08/03/12 1506

## 2012-10-13 ENCOUNTER — Encounter: Payer: Self-pay | Admitting: Gastroenterology

## 2012-11-23 ENCOUNTER — Other Ambulatory Visit: Payer: Self-pay | Admitting: Anesthesiology

## 2012-11-23 DIAGNOSIS — M542 Cervicalgia: Secondary | ICD-10-CM

## 2012-11-28 ENCOUNTER — Ambulatory Visit
Admission: RE | Admit: 2012-11-28 | Discharge: 2012-11-28 | Disposition: A | Discharge status: Home / Self Care | Payer: Medicare Other | Source: Ambulatory Visit | Attending: Anesthesiology | Admitting: Anesthesiology

## 2012-11-28 VITALS — BP 121/62 | HR 78

## 2012-11-28 DIAGNOSIS — M542 Cervicalgia: Secondary | ICD-10-CM

## 2012-11-28 MED ORDER — ONDANSETRON HCL 4 MG/2ML IJ SOLN: 4.0000 mg | Freq: Once | INTRAMUSCULAR | Status: DC

## 2012-11-28 MED ORDER — HYDROMORPHONE HCL PF 2 MG/ML IJ SOLN: 2.0000 mg | Freq: Once | INTRAMUSCULAR | Status: DC

## 2012-11-28 MED ORDER — DIAZEPAM 5 MG PO TABS
10.0000 mg | ORAL_TABLET | Freq: Once | ORAL | Status: AC
  Administered 2012-11-28: 10 mg via ORAL

## 2012-11-28 MED ORDER — IOHEXOL 300 MG/ML  SOLN
10.0000 mL | Freq: Once | INTRAMUSCULAR | Status: AC | PRN
  Administered 2012-11-28: 10 mL via INTRATHECAL

## 2012-11-28 NOTE — Progress Notes (Signed)
1145: to bathroom with assistance; gait slow but steady.  jkl 1153: friend and pastor at bedside; pt tearful.  jkl

## 2012-12-02 ENCOUNTER — Telehealth: Payer: Self-pay | Admitting: Radiology

## 2012-12-02 NOTE — Telephone Encounter (Signed)
Pt had called yesterday about headache post myelo. But due to the fact that the pt has chronic severe headaches, it was impossible to tell if it was from the myelo on Monday. Pt was asked to stay down for 24 hours and drink lots of fluids. Called her this am and her headache is improved, so she will continue to rest until it is gone. dd

## 2012-12-28 ENCOUNTER — Emergency Department (HOSPITAL_COMMUNITY): Payer: Medicare Other

## 2012-12-28 ENCOUNTER — Encounter (HOSPITAL_COMMUNITY): Payer: Self-pay | Admitting: *Deleted

## 2012-12-28 ENCOUNTER — Emergency Department (HOSPITAL_COMMUNITY)
Admission: EM | Admit: 2012-12-28 | Discharge: 2012-12-28 | Disposition: A | Discharge status: Home / Self Care | Payer: Medicare Other | Attending: Emergency Medicine | Admitting: Emergency Medicine

## 2012-12-28 DIAGNOSIS — S0990XA Unspecified injury of head, initial encounter: Secondary | ICD-10-CM | POA: Insufficient documentation

## 2012-12-28 DIAGNOSIS — W19XXXA Unspecified fall, initial encounter: Secondary | ICD-10-CM

## 2012-12-28 DIAGNOSIS — F3289 Other specified depressive episodes: Secondary | ICD-10-CM | POA: Insufficient documentation

## 2012-12-28 DIAGNOSIS — W1809XA Striking against other object with subsequent fall, initial encounter: Secondary | ICD-10-CM | POA: Insufficient documentation

## 2012-12-28 DIAGNOSIS — F17200 Nicotine dependence, unspecified, uncomplicated: Secondary | ICD-10-CM | POA: Insufficient documentation

## 2012-12-28 DIAGNOSIS — M542 Cervicalgia: Secondary | ICD-10-CM

## 2012-12-28 DIAGNOSIS — M545 Low back pain, unspecified: Secondary | ICD-10-CM | POA: Insufficient documentation

## 2012-12-28 DIAGNOSIS — Y929 Unspecified place or not applicable: Secondary | ICD-10-CM | POA: Insufficient documentation

## 2012-12-28 DIAGNOSIS — Z8679 Personal history of other diseases of the circulatory system: Secondary | ICD-10-CM | POA: Insufficient documentation

## 2012-12-28 DIAGNOSIS — F329 Major depressive disorder, single episode, unspecified: Secondary | ICD-10-CM | POA: Insufficient documentation

## 2012-12-28 DIAGNOSIS — Z79899 Other long term (current) drug therapy: Secondary | ICD-10-CM | POA: Insufficient documentation

## 2012-12-28 DIAGNOSIS — Y9389 Activity, other specified: Secondary | ICD-10-CM | POA: Insufficient documentation

## 2012-12-28 DIAGNOSIS — Z9889 Other specified postprocedural states: Secondary | ICD-10-CM | POA: Insufficient documentation

## 2012-12-28 DIAGNOSIS — G8929 Other chronic pain: Secondary | ICD-10-CM | POA: Insufficient documentation

## 2012-12-28 DIAGNOSIS — S0993XA Unspecified injury of face, initial encounter: Secondary | ICD-10-CM | POA: Insufficient documentation

## 2012-12-28 MED ORDER — HYDROMORPHONE HCL PF 1 MG/ML IJ SOLN
1.0000 mg | Freq: Once | INTRAMUSCULAR | Status: AC
  Administered 2012-12-28: 1 mg via INTRAVENOUS
  Filled 2012-12-28: qty 1

## 2012-12-28 MED ORDER — HYDROMORPHONE HCL PF 1 MG/ML IJ SOLN
1.0000 mg | Freq: Once | INTRAMUSCULAR | Status: AC
  Administered 2012-12-28: 1 mg via INTRAVENOUS

## 2012-12-28 MED ORDER — ONDANSETRON HCL 4 MG/2ML IJ SOLN
4.0000 mg | Freq: Once | INTRAMUSCULAR | Status: AC
  Administered 2012-12-28: 4 mg via INTRAVENOUS
  Filled 2012-12-28: qty 2

## 2012-12-28 NOTE — ED Provider Notes (Signed)
History  This chart was scribed for Lyanne Co, MD by Sofie Rower, ED Scribe. The patient was seen in room [facility]18/[facility]18 and the patient's care was started at 9:12PM.   CSN: [id number]  Arrival date & time 12/28/12  2115   None     Chief Complaint  Patient presents with  . Fall    (Consider location/radiation/quality/duration/timing/severity/associated sxs/prior treatment) The history is provided by the patient and a friend (Pt's pastor).    Rita [name] is a 52 y.o. female , with a hx of chronic lower back pain and depression, who presents to the Emergency Department complaining of neck pain, onset today (12/28/12) after falling forward. The pt's pastor reports the pt was ambulating with her walker earlier this evening, where she suddenly fell, impacting upon her face on the edge of her television. The pt has not taken any medications to relieve her neck pain prior to arrival. Modifying factors include certain movements and positions of the neck which intensify the neck pain.  The patient was placed in a cervical collar on arrival to the emergency department.  She denies weakness in her upper lower extremities.  Mild headache.  She vomited once at home.  Mild nausea at this time.  No weakness in her lower extremities.  No chest pain or shortness of breath.  No trauma to her chest.  No thoracic or lumbar back pain.  She does have long-standing history of chronic pain for which she takes chronic opioid pain medications at home.  The pt is a current everyday smoker (0.2 packs/day), however, she does not drink alcohol.   PCP is Dr. Gerda Diss.    Past Medical History  Diagnosis Date  . Chronic low back pain   . Depression   . Migraine headache     Past Surgical History  Procedure Date  . Cervical spine surgery   . Rotator cuff repair     left  . Back surgery   . Colonoscopy   . Endometrial ablation june 2012  . Abdominal hysterectomy 10/20/2011    Procedure: HYSTERECTOMY ABDOMINAL;   Surgeon: Tilda Burrow, MD;  Location: [facility];  Service: Gynecology;  Laterality: N/A;  . Scar revision 10/20/2011    Procedure: SCAR REVISION;  Surgeon: Tilda Burrow, MD;  Location: [facility];  Service: Gynecology;  Laterality: N/A;  Wide excision of Prior Cesarean Section Scar--Scar tissue discarded per Dr. Emelda Fear order    Family History  Problem Relation Age of Onset  . Anesthesia problems Neg Hx   . Hypotension Neg Hx   . Malignant hyperthermia Neg Hx   . Pseudochol deficiency Neg Hx     History  Substance Use Topics  . Smoking status: Current Every Day Smoker -- 0.2 packs/day for 20 years    Types: Cigarettes  . Smokeless tobacco: Not on file  . Alcohol Use: No    OB History    Grav Para Term Preterm Abortions TAB SAB Ect Mult Living   1 1 1       1       Review of Systems  10 Systems reviewed and all are negative except as noted in the HPI.    Allergies  Review of patient's allergies indicates no known allergies.  Home Medications   Current Outpatient Rx  Name  Route  Sig  Dispense  Refill  . CALCIUM CARBONATE-VITAMIN D 500-200 MG-UNIT PO TABS   Oral   Take 2 tablets by mouth daily.         Marland Kitchen  DIAZEPAM 5 MG PO TABS   Oral   Take 5 mg by mouth every 6 (six) hours.          . DULOXETINE HCL 60 MG PO CPEP   Oral   Take 60 mg by mouth at bedtime.          . METHOCARBAMOL 500 MG PO TABS   Oral   Take 500 mg by mouth every 8 (eight) hours as needed. pain         . ONDANSETRON HCL 8 MG PO TABS   Oral   Take 8 mg by mouth every 8 (eight) hours as needed. nausea         . OXYCODONE HCL ER 15 MG PO TB12   Oral   Take 15 mg by mouth every 4 (four) hours.           Marland Kitchen POLYETHYLENE GLYCOL 3350 PO PACK   Oral   Take 17 g by mouth daily.         . TOPIRAMATE 200 MG PO TABS   Oral   Take 200 mg by mouth daily.         Marland Kitchen UNABLE TO FIND   Oral   Take 1 tablet by mouth daily as needed. Med Name:  OTC Diuretic for swelling if needed           . VITAMIN C 500 MG PO TABS   Oral   Take 500 mg by mouth daily.           BP 135/76  Pulse 72  Temp 97.7 F (36.5 C) (Oral)  Resp 20  Ht 5\' 2"  (1.575 m)  Wt 160 lb (72.576 kg)  BMI 29.26 kg/m2  SpO2 100%  LMP 10/16/2011  Physical Exam  Nursing note and vitals reviewed. Constitutional: She is oriented to person, place, and time. She appears well-developed and well-nourished. No distress.  HENT:  Head: Normocephalic and atraumatic.       Tender at the bridge of nose and anterior maxillary sinuses bilaterally.  No deformity of the nose.  No epistaxis noted.  No trismus or malocclusion.  No dental injury.  Eyes: EOM are normal.  Neck: Normal range of motion.       Mild cervical and peri cervical tenderness.   Cardiovascular: Normal rate, regular rhythm and normal heart sounds.   Pulmonary/Chest: Effort normal and breath sounds normal.  Abdominal: Soft. She exhibits no distension. There is no tenderness.  Musculoskeletal: Normal range of motion.       Cervical back: She exhibits tenderness.  Neurological: She is alert and oriented to person, place, and time.       5/5 strength upper and lower extremities.   Skin: Skin is warm and dry.  Psychiatric: She has a normal mood and affect. Judgment normal.    ED Course  Procedures (including critical care time)  DIAGNOSTIC STUDIES: Oxygen Saturation is 100% on room air, normal by my interpretation.    COORDINATION OF CARE:   9:18 PM- Treatment plan discussed with patient. Pt agrees with treatment.     Labs Reviewed - No data to display   Ct Head Wo Contrast  12/28/2012  *RADIOLOGY REPORT*  Clinical Data:  Status post fall; neck pain.  Concern for head injury.  CT HEAD WITHOUT CONTRAST AND CT CERVICAL SPINE WITHOUT CONTRAST  Technique:  Multidetector CT imaging of the head and cervical spine was performed following the standard protocol without intravenous contrast.  Multiplanar CT image  reconstructions of the cervical  spine were also generated.  Comparison: CT myelogram of the cervical spine performed 11/28/2012, and CT of the head performed 07/24/2012  CT HEAD  Findings: There is no evidence of acute infarction, mass lesion, or intra- or extra-axial hemorrhage on CT.  The posterior fossa, including the cerebellum, brainstem and fourth ventricle, is within normal limits.  The third and lateral ventricles, and basal ganglia are unremarkable in appearance.  The cerebral hemispheres are symmetric in appearance, with normal gray- white differentiation.  No mass effect or midline shift is seen.  There is no evidence of fracture; visualized osseous structures are unremarkable in appearance.  The orbits are within normal limits. The paranasal sinuses and mastoid air cells are well-aerated.  No significant soft tissue abnormalities are seen.  IMPRESSION: No evidence of traumatic intracranial injury or fracture.  CT CERVICAL SPINE  Findings: There is no evidence of fracture or subluxation.  The patient is status post anterior cervical spinal fusion at C5-C6, with associated osseous fusion.  Vertebral bodies demonstrate normal height and alignment.  Intervertebral disc spaces are otherwise grossly preserved.  Prevertebral soft tissues are within normal limits.  The thyroid gland is unremarkable in appearance.  A tiny 3 mm nodular density is noted at the right lung apex; this is stable from the CT of the cervical spine performed 01/09/2011, and likely benign.  No significant soft tissue abnormalities are seen.  IMPRESSION:  1.  No evidence of fracture or subluxation along the cervical spine. 2.  Status post anterior cervical spinal fusion at C5-C6; hardware is unremarkable in appearance.   Original Report Authenticated By: Tonia Ghent, M.D.    Ct Cervical Spine Wo Contrast  12/28/2012  *RADIOLOGY REPORT*  Clinical Data:  Status post fall; neck pain.  Concern for head injury.  CT HEAD WITHOUT CONTRAST AND CT CERVICAL SPINE WITHOUT  CONTRAST  Technique:  Multidetector CT imaging of the head and cervical spine was performed following the standard protocol without intravenous contrast.  Multiplanar CT image reconstructions of the cervical spine were also generated.  Comparison: CT myelogram of the cervical spine performed 11/28/2012, and CT of the head performed 07/24/2012  CT HEAD  Findings: There is no evidence of acute infarction, mass lesion, or intra- or extra-axial hemorrhage on CT.  The posterior fossa, including the cerebellum, brainstem and fourth ventricle, is within normal limits.  The third and lateral ventricles, and basal ganglia are unremarkable in appearance.  The cerebral hemispheres are symmetric in appearance, with normal gray- white differentiation.  No mass effect or midline shift is seen.  There is no evidence of fracture; visualized osseous structures are unremarkable in appearance.  The orbits are within normal limits. The paranasal sinuses and mastoid air cells are well-aerated.  No significant soft tissue abnormalities are seen.  IMPRESSION: No evidence of traumatic intracranial injury or fracture.  CT CERVICAL SPINE  Findings: There is no evidence of fracture or subluxation.  The patient is status post anterior cervical spinal fusion at C5-C6, with associated osseous fusion.  Vertebral bodies demonstrate normal height and alignment.  Intervertebral disc spaces are otherwise grossly preserved.  Prevertebral soft tissues are within normal limits.  The thyroid gland is unremarkable in appearance.  A tiny 3 mm nodular density is noted at the right lung apex; this is stable from the CT of the cervical spine performed 01/09/2011, and likely benign.  No significant soft tissue abnormalities are seen.  IMPRESSION:  1.  No evidence of fracture or  subluxation along the cervical spine. 2.  Status post anterior cervical spinal fusion at C5-C6; hardware is unremarkable in appearance.   Original Report Authenticated By: Tonia Ghent, M.D.    I personally reviewed the imaging tests through PACS system I reviewed available ER/hospitalization records through the EMR    1. Neck pain   2. Minor head injury   3. Fall       MDM  10:55 PM The patient feels much better at this time.  CT head and cervical spine are normal.  No obvious facial fracture.  No trismus or malocclusion. Her pain is improving in the emergency department.  Discharge home in good condition.  The patient has chronic pain medicine at home that she can continue to use for her pain.      I personally performed the services described in this documentation, which was scribed in my presence. The recorded information has been reviewed and is accurate.      Lyanne Co, MD 12/28/12 2258

## 2012-12-28 NOTE — ED Notes (Signed)
Possible fall around 0800 this am

## 2012-12-28 NOTE — ED Notes (Signed)
Pt assisted from car onto stretcher, answering questions  Says she fell when trying to reach her walker and later "woke up" on the floor.

## 2013-03-21 ENCOUNTER — Ambulatory Visit (INDEPENDENT_AMBULATORY_CARE_PROVIDER_SITE_OTHER): Payer: Medicare Other | Admitting: Family Medicine

## 2013-03-21 ENCOUNTER — Encounter: Payer: Self-pay | Admitting: Family Medicine

## 2013-03-21 VITALS — BP 120/88 | Temp 98.6°F | Ht 62.0 in | Wt 181.6 lb

## 2013-03-21 DIAGNOSIS — R300 Dysuria: Secondary | ICD-10-CM

## 2013-03-21 DIAGNOSIS — J019 Acute sinusitis, unspecified: Secondary | ICD-10-CM

## 2013-03-21 LAB — POCT URINALYSIS DIPSTICK
Spec Grav, UA: <=1.005
pH, UA: 7

## 2013-03-21 MED ORDER — CEFPROZIL 500 MG PO TABS: 500.0000 mg | ORAL_TABLET | Freq: Two times a day (BID) | ORAL | Status: DC

## 2013-03-21 MED ORDER — PREDNISONE 20 MG PO TABS: ORAL_TABLET | ORAL | Status: DC

## 2013-03-21 NOTE — Patient Instructions (Signed)

## 2013-03-21 NOTE — Progress Notes (Signed)
  Subjective:    Patient ID: Rita [name], female    DOB: 04/28/1961, 52 y.o.   MRN: [id number]  Sinusitis This is a recurrent problem. The current episode started more than 1 month ago. The problem has been gradually worsening since onset. The maximum temperature recorded prior to her arrival was 100 - 100.9 F. The fever has been present for 1 to 2 days. The pain is mild. Associated symptoms include coughing, headaches, sinus pressure, sneezing and a sore throat. Pertinent negatives include no chills, congestion or diaphoresis. Past treatments include antibiotics. The treatment provided no relief.  Urinary Frequency  This is a new problem. The current episode started 1 to 4 weeks ago. The problem occurs intermittently. The problem has been unchanged. The patient is experiencing no pain. There has been no fever. There is no history of pyelonephritis. Associated symptoms include frequency. Pertinent negatives include no chills. She has tried nothing for the symptoms. The treatment provided no relief. There is no history of catheterization, kidney stones or recurrent UTIs.      Review of Systems  Constitutional: Negative for chills and diaphoresis.  HENT: Positive for sore throat, sneezing and sinus pressure. Negative for congestion.   Respiratory: Positive for cough.   Genitourinary: Positive for frequency.  Neurological: Positive for headaches.       Objective:   Physical Exam  Constitutional: She appears well-developed and well-nourished.  HENT:  Head: Normocephalic.  Right Ear: External ear normal.  Left Ear: External ear normal.  Moderate sinus tenderness and pain.  Neck: Normal range of motion. Neck supple. No tracheal deviation present.  Cardiovascular: Normal rate, regular rhythm and normal heart sounds.   Pulmonary/Chest: Effort normal and breath sounds normal.  Lymphadenopathy:    She has no cervical adenopathy.          Assessment & Plan:  Dysuria - Plan: POCT  urinalysis dipstick, POCT UA - Microscopic Only  Acute sinusitis the patient was told that her urine looked good she does not need any antibiotics for this.  Patient has persistent sinusitis Cefzil 500 twice a day for the next 2 weeks if not significantly improving she will need to have lab work and possibly referral to ENT patient is aware of this prednisone over the next 6 days to minimize some of her symptoms and reduce congestion and

## 2013-05-11 ENCOUNTER — Telehealth: Payer: Self-pay | Admitting: *Deleted

## 2013-05-11 NOTE — Telephone Encounter (Signed)
Pt wants a letter stating all dates of visits since jan 2014 and dx and meds given. Pt wants letter to state she had vomiting and diarrhea and that she was taking mucinex and nyquil.

## 2013-05-11 NOTE — Telephone Encounter (Signed)
We can give her list of times seen and dx

## 2013-05-11 NOTE — Telephone Encounter (Signed)
Patient notified

## 2013-05-11 NOTE — Telephone Encounter (Signed)
Dates seen with the dx written on letterhead per doctors orders.

## 2013-05-12 ENCOUNTER — Telehealth: Payer: Self-pay | Admitting: Family Medicine

## 2013-05-12 NOTE — Telephone Encounter (Signed)
Error

## 2013-11-09 ENCOUNTER — Encounter: Payer: Self-pay | Admitting: Nurse Practitioner

## 2013-11-09 ENCOUNTER — Ambulatory Visit (INDEPENDENT_AMBULATORY_CARE_PROVIDER_SITE_OTHER): Payer: Medicare Other | Admitting: Nurse Practitioner

## 2013-11-09 VITALS — BP 128/78 | Ht 63.0 in | Wt 182.8 lb

## 2013-11-09 DIAGNOSIS — M549 Dorsalgia, unspecified: Secondary | ICD-10-CM

## 2013-11-09 DIAGNOSIS — Z0000 Encounter for general adult medical examination without abnormal findings: Secondary | ICD-10-CM

## 2013-11-09 DIAGNOSIS — Z01419 Encounter for gynecological examination (general) (routine) without abnormal findings: Secondary | ICD-10-CM

## 2013-11-09 DIAGNOSIS — IMO0002 Reserved for concepts with insufficient information to code with codable children: Secondary | ICD-10-CM

## 2013-11-09 DIAGNOSIS — R5381 Other malaise: Secondary | ICD-10-CM

## 2013-11-09 DIAGNOSIS — G8929 Other chronic pain: Secondary | ICD-10-CM

## 2013-11-13 ENCOUNTER — Encounter: Payer: Self-pay | Admitting: Nurse Practitioner

## 2013-11-14 ENCOUNTER — Ambulatory Visit (HOSPITAL_COMMUNITY)
Admission: RE | Admit: 2013-11-14 | Discharge: 2013-11-14 | Disposition: A | Discharge status: Home / Self Care | Payer: Medicare Other | Source: Ambulatory Visit | Attending: Nurse Practitioner | Admitting: Nurse Practitioner

## 2013-11-14 DIAGNOSIS — Z1231 Encounter for screening mammogram for malignant neoplasm of breast: Secondary | ICD-10-CM | POA: Insufficient documentation

## 2013-11-14 DIAGNOSIS — G8929 Other chronic pain: Secondary | ICD-10-CM | POA: Insufficient documentation

## 2013-11-14 DIAGNOSIS — IMO0002 Reserved for concepts with insufficient information to code with codable children: Secondary | ICD-10-CM | POA: Insufficient documentation

## 2013-11-14 DIAGNOSIS — Z0000 Encounter for general adult medical examination without abnormal findings: Secondary | ICD-10-CM

## 2013-11-14 NOTE — Progress Notes (Signed)
  Subjective:    Patient ID: Rita [name], female    DOB: 05-Feb-1961, 52 y.o.   MRN: [id number]  HPI presents for her wellness checkup. Wears dentures. Denies any oral lesions or sores. Has regular eye exams. No pelvic pain. No discharge. No new sexual partners. Smokes half pack per day. Has joined [employer] to help her lose weight. Activity limited due to orthopedic issues.    Review of Systems  Constitutional: Negative for activity change, appetite change and fatigue.  HENT: Negative for ear pain, hearing loss, mouth sores and sore throat.   Eyes: Negative for visual disturbance.  Respiratory: Negative for cough, chest tightness, shortness of breath and wheezing.   Cardiovascular: Negative for chest pain and leg swelling.  Gastrointestinal: Negative for nausea, vomiting, abdominal pain, diarrhea, constipation and blood in stool.  Genitourinary: Negative for dysuria, urgency, frequency, vaginal discharge, difficulty urinating, genital sores, menstrual problem and pelvic pain.  Musculoskeletal: Positive for arthralgias and back pain.       Objective:   Physical Exam  Vitals reviewed. Constitutional: She is oriented to person, place, and time. She appears well-developed. No distress.  HENT:  Right Ear: External ear normal.  Left Ear: External ear normal.  Mouth/Throat: Oropharynx is clear and moist.  Neck: Normal range of motion. Neck supple. No tracheal deviation present. No thyromegaly present.  Cardiovascular: Normal rate, regular rhythm and normal heart sounds.  Exam reveals no gallop.   No murmur heard. Pulmonary/Chest: Effort normal and breath sounds normal.  Abdominal: Soft. She exhibits no distension. There is no tenderness.  Genitourinary: Vagina normal. No vaginal discharge found.  Musculoskeletal: She exhibits no edema.  Lymphadenopathy:    She has no cervical adenopathy.  Neurological: She is alert and oriented to person, place, and time.  Skin: Skin is warm and  dry. No rash noted.  Psychiatric: She has a normal mood and affect. Her behavior is normal.   Breast exam: No masses, axilla no adenopathy. External GU normal. Vagina a slightly pale and moist, no discharge noted. Rectal exam normal, no stool for Hemoccult.        Assessment & Plan:  Well woman exam  Routine general medical examination at a health care facility - Plan: Basic metabolic panel, Hepatic function panel, Lipid panel, TSH, Vit D  25 hydroxy (rtn osteoporosis monitoring), MM Digital Screening  Other malaise and fatigue - Plan: TSH, Vit D  25 hydroxy (rtn osteoporosis monitoring)  Degenerative disc disease - Plan: DG Bone Density  Chronic back pain - Plan: DG Bone Density  Activity as tolerated. Continue efforts at weight loss and healthy diet. Recommend daily vitamin D and calcium supplementation. Next physical in one year.

## 2013-11-15 ENCOUNTER — Ambulatory Visit (HOSPITAL_COMMUNITY)
Admission: RE | Admit: 2013-11-15 | Discharge: 2013-11-15 | Disposition: A | Discharge status: Home / Self Care | Payer: Medicare Other | Source: Ambulatory Visit | Attending: Nurse Practitioner | Admitting: Nurse Practitioner

## 2013-11-15 DIAGNOSIS — M549 Dorsalgia, unspecified: Secondary | ICD-10-CM | POA: Insufficient documentation

## 2013-11-15 DIAGNOSIS — M899 Disorder of bone, unspecified: Secondary | ICD-10-CM | POA: Insufficient documentation

## 2013-11-15 DIAGNOSIS — IMO0002 Reserved for concepts with insufficient information to code with codable children: Secondary | ICD-10-CM

## 2013-11-15 DIAGNOSIS — G8929 Other chronic pain: Secondary | ICD-10-CM

## 2013-11-20 ENCOUNTER — Encounter: Payer: Self-pay | Admitting: Nurse Practitioner

## 2013-11-20 DIAGNOSIS — M8580 Other specified disorders of bone density and structure, unspecified site: Secondary | ICD-10-CM | POA: Insufficient documentation

## 2013-11-21 LAB — BASIC METABOLIC PANEL
BUN: 10 mg/dL (ref 6–23)
Chloride: 105 mEq/L (ref 96–112)
Creat: 0.69 mg/dL (ref 0.50–1.10)
Glucose, Bld: 95 mg/dL (ref 70–99)
Potassium: 4 mEq/L (ref 3.5–5.3)

## 2013-11-21 LAB — LIPID PANEL
Cholesterol: 176 mg/dL (ref 0–200)
Triglycerides: 190 mg/dL — ABNORMAL HIGH (ref <150)
VLDL: 38 mg/dL (ref 0–40)

## 2013-11-21 LAB — HEPATIC FUNCTION PANEL
ALT: 10 U/L (ref 0–35)
AST: 14 U/L (ref 0–37)
Albumin: 4.5 g/dL (ref 3.5–5.2)
Bilirubin, Direct: <0.1 mg/dL (ref 0.0–0.3)
Total Bilirubin: 0.3 mg/dL (ref 0.3–1.2)

## 2013-11-22 LAB — VITAMIN D 25 HYDROXY (VIT D DEFICIENCY, FRACTURES): Vit D, 25-Hydroxy: 33 ng/mL (ref 30–89)

## 2013-11-29 ENCOUNTER — Encounter: Payer: Self-pay | Admitting: Nurse Practitioner

## 2014-02-02 ENCOUNTER — Other Ambulatory Visit (HOSPITAL_COMMUNITY): Payer: Self-pay | Admitting: Anesthesiology

## 2014-02-02 ENCOUNTER — Ambulatory Visit (HOSPITAL_COMMUNITY)
Admission: RE | Admit: 2014-02-02 | Discharge: 2014-02-02 | Disposition: A | Discharge status: Home / Self Care | Payer: Medicare Other | Source: Ambulatory Visit | Attending: Anesthesiology | Admitting: Anesthesiology

## 2014-02-02 DIAGNOSIS — R209 Unspecified disturbances of skin sensation: Secondary | ICD-10-CM | POA: Insufficient documentation

## 2014-02-02 DIAGNOSIS — Z9181 History of falling: Secondary | ICD-10-CM | POA: Insufficient documentation

## 2014-02-02 DIAGNOSIS — M25559 Pain in unspecified hip: Secondary | ICD-10-CM | POA: Insufficient documentation

## 2014-02-02 DIAGNOSIS — M25551 Pain in right hip: Secondary | ICD-10-CM

## 2014-02-02 DIAGNOSIS — M25552 Pain in left hip: Secondary | ICD-10-CM

## 2014-02-02 DIAGNOSIS — Z981 Arthrodesis status: Secondary | ICD-10-CM | POA: Insufficient documentation

## 2014-10-29 ENCOUNTER — Encounter: Payer: Self-pay | Admitting: Nurse Practitioner

## 2015-12-06 ENCOUNTER — Other Ambulatory Visit: Payer: Self-pay | Admitting: Anesthesiology

## 2015-12-06 DIAGNOSIS — M542 Cervicalgia: Secondary | ICD-10-CM

## 2015-12-18 ENCOUNTER — Ambulatory Visit
Admission: RE | Admit: 2015-12-18 | Discharge: 2015-12-18 | Disposition: A | Discharge status: Home / Self Care | Payer: Medicare Other | Source: Ambulatory Visit | Attending: Anesthesiology | Admitting: Anesthesiology

## 2015-12-18 DIAGNOSIS — M542 Cervicalgia: Secondary | ICD-10-CM

## 2015-12-18 MED ORDER — GADOBENATE DIMEGLUMINE 529 MG/ML IV SOLN
12.0000 mL | Freq: Once | INTRAVENOUS | Status: AC | PRN
  Administered 2015-12-18: 12 mL via INTRAVENOUS

## 2020-01-31 ENCOUNTER — Encounter: Payer: Self-pay | Admitting: Family Medicine

## 2021-01-28 DEATH — deceased
